# Patient Record
Sex: Female | Born: 1980 | Race: White | Hispanic: No | Smoking: Never smoker
Health system: Southern US, Community
[De-identification: ages and names within clinical notes are randomized; demographics above are authoritative.]

## PROBLEM LIST (undated history)

## (undated) ENCOUNTER — Inpatient Hospital Stay (HOSPITAL_COMMUNITY): Payer: Self-pay

## (undated) DIAGNOSIS — F419 Anxiety disorder, unspecified: Secondary | ICD-10-CM

## (undated) DIAGNOSIS — E559 Vitamin D deficiency, unspecified: Secondary | ICD-10-CM

## (undated) DIAGNOSIS — E611 Iron deficiency: Secondary | ICD-10-CM

## (undated) DIAGNOSIS — E039 Hypothyroidism, unspecified: Secondary | ICD-10-CM

## (undated) DIAGNOSIS — Z87442 Personal history of urinary calculi: Secondary | ICD-10-CM

## (undated) DIAGNOSIS — Z8632 Personal history of gestational diabetes: Secondary | ICD-10-CM

## (undated) DIAGNOSIS — D649 Anemia, unspecified: Secondary | ICD-10-CM

## (undated) HISTORY — DX: Vitamin D deficiency, unspecified: E55.9

## (undated) HISTORY — DX: Anxiety disorder, unspecified: F41.9

## (undated) HISTORY — DX: Personal history of gestational diabetes: Z86.32

## (undated) HISTORY — DX: Hypothyroidism, unspecified: E03.9

## (undated) HISTORY — DX: Personal history of urinary calculi: Z87.442

## (undated) HISTORY — PX: NO PAST SURGERIES: SHX2092

## (undated) HISTORY — DX: Anemia, unspecified: D64.9

## (undated) HISTORY — DX: Iron deficiency: E61.1

---
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
## 2001-08-14 ENCOUNTER — Other Ambulatory Visit: Admission: RE | Admit: 2001-08-14 | Discharge: 2001-08-14 | Payer: Self-pay | Admitting: Gynecology

## 2002-08-23 ENCOUNTER — Other Ambulatory Visit: Admission: RE | Admit: 2002-08-23 | Discharge: 2002-08-23 | Payer: Self-pay | Admitting: Gynecology

## 2003-09-18 ENCOUNTER — Other Ambulatory Visit: Admission: RE | Admit: 2003-09-18 | Discharge: 2003-09-18 | Payer: Self-pay | Admitting: Gynecology

## 2004-11-27 ENCOUNTER — Other Ambulatory Visit: Admission: RE | Admit: 2004-11-27 | Discharge: 2004-11-27 | Payer: Self-pay | Admitting: Gynecology

## 2005-12-02 ENCOUNTER — Other Ambulatory Visit: Admission: RE | Admit: 2005-12-02 | Discharge: 2005-12-02 | Payer: Self-pay | Admitting: Gynecology

## 2006-12-13 ENCOUNTER — Other Ambulatory Visit: Admission: RE | Admit: 2006-12-13 | Discharge: 2006-12-13 | Payer: Self-pay | Admitting: Gynecology

## 2008-02-14 ENCOUNTER — Other Ambulatory Visit: Admission: RE | Admit: 2008-02-14 | Discharge: 2008-02-14 | Payer: Self-pay | Admitting: Gynecology

## 2009-02-13 ENCOUNTER — Ambulatory Visit: Payer: Self-pay | Admitting: Women's Health

## 2009-02-26 ENCOUNTER — Ambulatory Visit: Payer: Self-pay | Admitting: Women's Health

## 2009-08-12 ENCOUNTER — Encounter: Admission: RE | Admit: 2009-08-12 | Discharge: 2009-08-12 | Payer: Self-pay | Admitting: Obstetrics and Gynecology

## 2009-10-16 ENCOUNTER — Inpatient Hospital Stay (HOSPITAL_COMMUNITY): Admission: AD | Admit: 2009-10-16 | Discharge: 2009-10-20 | Payer: Self-pay | Admitting: Obstetrics and Gynecology

## 2009-10-18 ENCOUNTER — Encounter (INDEPENDENT_AMBULATORY_CARE_PROVIDER_SITE_OTHER): Payer: Self-pay | Admitting: Obstetrics and Gynecology

## 2011-02-16 LAB — CBC
HCT: 39.2 % (ref 36.0–46.0)
Hemoglobin: 10.5 g/dL — ABNORMAL LOW (ref 12.0–15.0)
Hemoglobin: 12.9 g/dL (ref 12.0–15.0)
MCV: 90.9 fL (ref 78.0–100.0)
Platelets: 110 10*3/uL — ABNORMAL LOW (ref 150–400)
Platelets: 146 10*3/uL — ABNORMAL LOW (ref 150–400)
RDW: 14 % (ref 11.5–15.5)
WBC: 9.9 10*3/uL (ref 4.0–10.5)

## 2011-02-16 LAB — GLUCOSE, CAPILLARY
Glucose-Capillary: 113 mg/dL — ABNORMAL HIGH (ref 70–99)
Glucose-Capillary: 78 mg/dL (ref 70–99)
Glucose-Capillary: 87 mg/dL (ref 70–99)
Glucose-Capillary: 93 mg/dL (ref 70–99)

## 2011-02-16 LAB — URINE CULTURE: Colony Count: NO GROWTH

## 2011-02-16 LAB — RH IMMUNE GLOB WKUP(>/=20WKS)(NOT WOMEN'S HOSP): Fetal Screen: NEGATIVE

## 2012-11-15 NOTE — L&D Delivery Note (Signed)
Delivery Note At 1:55 AM a viable and healthy female was delivered via Vaginal, Spontaneous Delivery (Presentation: Right Occiput Anterior).  APGAR: 8, 9; weight .  pending Placenta status: Intact, Spontaneous.not sent  Cord: 3 vessels with the following complications: None.  Cord pH: none  Anesthesia: Epidural  Episiotomy: None Lacerations: 2nd degree;Sulcus;Vaginal;Perineal Suture Repair: 3.0 chromic Est. Blood Loss (mL): 300  Mom to postpartum.  Baby to nursery-stable.  Deborah King A 07/24/2013, 2:28 AM

## 2012-12-19 LAB — OB RESULTS CONSOLE ABO/RH

## 2012-12-19 LAB — OB RESULTS CONSOLE RUBELLA ANTIBODY, IGM: Rubella: IMMUNE

## 2012-12-19 LAB — OB RESULTS CONSOLE HEPATITIS B SURFACE ANTIGEN: Hepatitis B Surface Ag: NEGATIVE

## 2012-12-19 LAB — OB RESULTS CONSOLE HIV ANTIBODY (ROUTINE TESTING): HIV: NONREACTIVE

## 2013-01-02 LAB — OB RESULTS CONSOLE GC/CHLAMYDIA
Chlamydia: NEGATIVE
Gonorrhea: NEGATIVE

## 2013-03-12 ENCOUNTER — Ambulatory Visit (HOSPITAL_COMMUNITY)
Admission: RE | Admit: 2013-03-12 | Discharge: 2013-03-12 | Disposition: A | Discharge status: Home / Self Care | Payer: BC Managed Care – PPO | Source: Ambulatory Visit | Attending: Obstetrics and Gynecology | Admitting: Obstetrics and Gynecology

## 2013-03-12 ENCOUNTER — Other Ambulatory Visit (HOSPITAL_COMMUNITY): Payer: Self-pay | Admitting: Obstetrics and Gynecology

## 2013-03-12 ENCOUNTER — Encounter (HOSPITAL_COMMUNITY): Payer: Self-pay | Admitting: General Practice

## 2013-03-12 ENCOUNTER — Encounter (HOSPITAL_COMMUNITY): Payer: Self-pay

## 2013-03-12 ENCOUNTER — Inpatient Hospital Stay (HOSPITAL_COMMUNITY)
Admission: AD | Admit: 2013-03-12 | Discharge: 2013-03-13 | DRG: 886 | Disposition: A | Discharge status: Home / Self Care | Payer: BC Managed Care – PPO | Source: Ambulatory Visit | Attending: Obstetrics and Gynecology | Admitting: Obstetrics and Gynecology

## 2013-03-12 DIAGNOSIS — R319 Hematuria, unspecified: Secondary | ICD-10-CM

## 2013-03-12 DIAGNOSIS — R1031 Right lower quadrant pain: Secondary | ICD-10-CM | POA: Insufficient documentation

## 2013-03-12 DIAGNOSIS — O210 Mild hyperemesis gravidarum: Secondary | ICD-10-CM | POA: Insufficient documentation

## 2013-03-12 DIAGNOSIS — N200 Calculus of kidney: Secondary | ICD-10-CM | POA: Diagnosis present

## 2013-03-12 DIAGNOSIS — O26839 Pregnancy related renal disease, unspecified trimester: Principal | ICD-10-CM | POA: Diagnosis present

## 2013-03-12 DIAGNOSIS — N1330 Unspecified hydronephrosis: Secondary | ICD-10-CM | POA: Diagnosis present

## 2013-03-12 DIAGNOSIS — N201 Calculus of ureter: Secondary | ICD-10-CM | POA: Insufficient documentation

## 2013-03-12 DIAGNOSIS — O99891 Other specified diseases and conditions complicating pregnancy: Secondary | ICD-10-CM | POA: Insufficient documentation

## 2013-03-12 MED ORDER — LACTATED RINGERS IV SOLN
INTRAVENOUS | Status: DC
  Administered 2013-03-12 – 2013-03-13 (×4): via INTRAVENOUS

## 2013-03-12 MED ORDER — ACETAMINOPHEN 325 MG PO TABS: 650.0000 mg | ORAL_TABLET | ORAL | Status: DC | PRN

## 2013-03-12 MED ORDER — DOCUSATE SODIUM 100 MG PO CAPS
100.0000 mg | ORAL_CAPSULE | Freq: Every day | ORAL | Status: DC
  Administered 2013-03-13: 100 mg via ORAL
  Filled 2013-03-12: qty 1

## 2013-03-12 MED ORDER — CALCIUM CARBONATE ANTACID 500 MG PO CHEW: 2.0000 | CHEWABLE_TABLET | ORAL | Status: DC | PRN

## 2013-03-12 MED ORDER — PRENATAL MULTIVITAMIN CH
1.0000 | ORAL_TABLET | Freq: Every day | ORAL | Status: DC
Start: 2013-03-13 — End: 2013-03-13
  Administered 2013-03-13: 1 via ORAL
  Filled 2013-03-12: qty 1

## 2013-03-12 MED ORDER — ZOLPIDEM TARTRATE 5 MG PO TABS: 5.0000 mg | ORAL_TABLET | Freq: Every evening | ORAL | Status: DC | PRN

## 2013-03-12 MED ORDER — HYDROCODONE-ACETAMINOPHEN 5-325 MG PO TABS
1.0000 | ORAL_TABLET | Freq: Four times a day (QID) | ORAL | Status: DC | PRN
Start: 2013-03-12 — End: 2013-03-13

## 2013-03-12 NOTE — H&P (Signed)
Deborah King is a 32 y.o. female  G34P1001 MWF @ [redacted] weeks gestation presenting for admission after diagnosis of right kidney stone with associated right hydronephrosis. Pt was seen emergently for evaluation of  acute right lower quadrant pain @ 6:15 am. Pt noted pain radiates to right back. (+) nausea and one episode of vomiting. She denies any urinary symptoms. BM regular. (+) FM.Marland Kitchen Evaluation showed moderate microscopic hematuria. Urine culture sent. Pt was sent for Ct scan which showed 4 mm right stone w/ hydronephrosis.  History OB History   Grav Para Term Preterm Abortions TAB SAB Ect Mult Living   2 1 1       1      Past Medical History  Diagnosis Date  . Medical history non-contributory    Past Surgical History  Procedure Laterality Date  . No past surgeries     Family History: family history is not on file. Social History:  reports that she has never smoked. She does not have any smokeless tobacco history on file. She reports that she does not drink alcohol or use illicit drugs.   Prenatal Transfer Tool  Maternal Diabetes: No Genetic Screening: Declined Maternal Ultrasounds/Referrals: Normal Fetal Ultrasounds or other Referrals:  None Maternal Substance Abuse:  No Significant Maternal Medications:  None Significant Maternal Lab Results:  None Other Comments:  Rh neg. early 1hr GCT @ 16 weeks nl. repeat 1hr GCt @ 28 weeks planned  ROS neg except HPI   Blood pressure 121/75, pulse 91, temperature 98.3 F (36.8 C), temperature source Oral, resp. rate 20, height 5\' 6"  (1.676 m), weight 74.39 kg (164 lb), last menstrual period 10/23/2012. Maternal Exam:  Introitus: Normal vulva. Normal vagina.  Cervix: Cervix evaluated by digital exam.     Fetal Exam Fetal Monitor Review: Mode: hand-held doppler probe.       Physical Exam  Vitals reviewed. Constitutional: She is oriented to person, place, and time. She appears well-developed and well-nourished. She appears distressed.   Neck: Neck supple.  Cardiovascular: Normal rate and regular rhythm.   Respiratory: Breath sounds normal.  GI: Soft.  Genitourinary: Vagina normal.  Musculoskeletal: She exhibits no edema.  Neurological: She is alert and oriented to person, place, and time.  Skin: Skin is warm and dry.  Psychiatric: She has a normal mood and affect.    Prenatal labs: ABO, Rh:  O neg Antibody:  neg Rubella:  Immune RPR:   NR HBsAg:   neg HIV:   neg GBS:   not done as yet  Assessment/Plan: Right kidney stone IUP @ 20 weeks Right hydronephrosis P) admit. Aggressive IV fluid. FHR q shift. Lemonade.strain urine for stone. If no further pain and no evidence of stone, sono to assess hydronephrosis  Tomorrow. ucx sent from office  Bambi Fehnel A 03/12/2013, 6:14 PM

## 2013-03-13 ENCOUNTER — Encounter (HOSPITAL_COMMUNITY): Payer: Self-pay | Admitting: Obstetrics and Gynecology

## 2013-03-13 ENCOUNTER — Inpatient Hospital Stay (HOSPITAL_COMMUNITY): Payer: BC Managed Care – PPO

## 2013-03-13 NOTE — Progress Notes (Signed)
HD #2 S: denies any back or abdominal pain (+) FM O: VSS Afebrile Lungs clear to A Cor: RRR Abdomen; gravid nontender Back(-)CVAT  Renal sono results reviewed w/ pt: right hydronephrosis, bilateral jets  ImP: RLQ pain resolved Right kidney stone with right hydronephrosis IUP @ 20 1/7 weeks P) d/c home. Cont increase fluid particularly lemonade, strain urine. Call if back pain recurs. Keep sched appt 5/9

## 2013-03-13 NOTE — Progress Notes (Signed)
Discharge teaching complete. Pt verbalizes understanding of discharge information.  Pt stable and denies pain or discomfort.  Diet for patients with kidney stones handout given to pt.  Ambulated to car.  Husband driving pt home.

## 2013-03-13 NOTE — Discharge Summary (Signed)
Obstetric Discharge Summary Reason for Admission: right kidney stone, RLQ pain, IUP @ 20 weeks, right hydronephrosis Prenatal Procedures: ultrasound Intrapartum Procedures: IVF Postpartum Procedures: n/King Complications-Operative and Postpartum: n/King Hemoglobin  Date Value Range Status  10/19/2009 10.5* 12.0 - 15.0 g/dL Final     HCT  Date Value Range Status  10/19/2009 31.1* 36.0 - 46.0 % Final    Physical Exam:  General:WDWN inNAD Skin (-) lesion Lungs clear to King Cor RRR Abd gravid@ u, nontender Pelvic deferred Back (-) CVAT Ext(-) edema  Hospital course: pt was admitted after w/u for acute RLQ pain revealed right kidney stone w/ right hydronephrosis. Pt was given aggressive IVF. Pain resolved however renal sono showed persistence of right hydronephrosis . Given resolution of pain, pt was deemed stable for discharge Discharge Diagnoses: right kidney stone, right hydronephrosis, IUP@ 20 1/7 weeks  Discharge Information: Date: 03/13/2013 Activity: unrestricted Diet: routine Medications: PNV Condition: stable Instructions: call if pain recurs Discharge to: home Follow-up Information   Follow up with Deborah Conway A, MD On 03/23/2013.   Contact information:   [address] [place] [address] [phone]       Newborn Data: This patient has no babies on file  Deborah King King 03/13/2013, 12:59 PM

## 2013-05-30 ENCOUNTER — Encounter: Payer: Self-pay | Admitting: *Deleted

## 2013-05-30 ENCOUNTER — Encounter: Payer: BC Managed Care – PPO | Attending: Obstetrics and Gynecology | Admitting: *Deleted

## 2013-05-30 VITALS — Ht 64.0 in | Wt 176.3 lb

## 2013-05-30 DIAGNOSIS — Z8632 Personal history of gestational diabetes: Secondary | ICD-10-CM

## 2013-05-30 DIAGNOSIS — O99810 Abnormal glucose complicating pregnancy: Secondary | ICD-10-CM | POA: Insufficient documentation

## 2013-05-30 DIAGNOSIS — Z713 Dietary counseling and surveillance: Secondary | ICD-10-CM | POA: Insufficient documentation

## 2013-05-30 NOTE — Patient Instructions (Signed)
GOALS:  Check glucose levels per MD as instructed  Follow Gestational Diabetes Diet as instructed  Call for follow-up as needed 

## 2013-05-30 NOTE — Progress Notes (Signed)
Patient was seen on 05-30-13  for Gestational Diabetes self-management class at the [facility]. The following learning objectives were met by the patient during this course:             States the definition of Gestational Diabetes           States why dietary management is important in controlling blood glucose           Describes the effects each nutrient has on blood glucose levels           Demonstrates ability to create a balanced meal plan          Demonstrates carbohydrate counting            States when to check blood glucose levels          Demonstrates proper blood glucose monitoring techniques           States the effect of stress and exercise on blood glucose levels          States the importance of limiting caffeine and abstaining from alcohol and smoking   Blood glucose monitor given: Contour Next EZ Lot # [id number] Exp: 10/2014 Blood glucose reading: 89 mg/dl  Patient instructed to monitor glucose levels: FBS: 60 - <90 1 hour: <140 2 hour: <120   *Patient received handouts:           Nutrition Diabetes and Pregnancy          Carbohydrate Counting List   Patient will be seen for follow-up as needed

## 2013-05-31 ENCOUNTER — Encounter: Payer: Self-pay | Admitting: *Deleted

## 2013-07-18 ENCOUNTER — Telehealth (HOSPITAL_COMMUNITY): Payer: Self-pay | Admitting: *Deleted

## 2013-07-18 ENCOUNTER — Encounter (HOSPITAL_COMMUNITY): Payer: Self-pay | Admitting: *Deleted

## 2013-07-18 NOTE — Telephone Encounter (Signed)
Preadmission screen  

## 2013-07-19 ENCOUNTER — Other Ambulatory Visit: Payer: Self-pay | Admitting: Obstetrics and Gynecology

## 2013-07-23 ENCOUNTER — Inpatient Hospital Stay (HOSPITAL_COMMUNITY): Payer: BC Managed Care – PPO | Admitting: Anesthesiology

## 2013-07-23 ENCOUNTER — Encounter (HOSPITAL_COMMUNITY): Payer: Self-pay

## 2013-07-23 ENCOUNTER — Inpatient Hospital Stay (HOSPITAL_COMMUNITY)
Admission: RE | Admit: 2013-07-23 | Discharge: 2013-07-25 | DRG: 372 | Disposition: A | Discharge status: Home / Self Care | Payer: BC Managed Care – PPO | Source: Ambulatory Visit | Attending: Obstetrics and Gynecology | Admitting: Obstetrics and Gynecology

## 2013-07-23 ENCOUNTER — Encounter (HOSPITAL_COMMUNITY): Payer: Self-pay | Admitting: Anesthesiology

## 2013-07-23 VITALS — BP 106/66 | HR 79 | Temp 97.4°F | Resp 18 | Ht 64.0 in | Wt 190.0 lb

## 2013-07-23 DIAGNOSIS — O9903 Anemia complicating the puerperium: Secondary | ICD-10-CM | POA: Diagnosis not present

## 2013-07-23 DIAGNOSIS — O99814 Abnormal glucose complicating childbirth: Principal | ICD-10-CM | POA: Diagnosis present

## 2013-07-23 DIAGNOSIS — D62 Acute posthemorrhagic anemia: Secondary | ICD-10-CM | POA: Diagnosis not present

## 2013-07-23 DIAGNOSIS — O360990 Maternal care for other rhesus isoimmunization, unspecified trimester, not applicable or unspecified: Secondary | ICD-10-CM | POA: Diagnosis present

## 2013-07-23 DIAGNOSIS — O2690 Pregnancy related conditions, unspecified, unspecified trimester: Secondary | ICD-10-CM

## 2013-07-23 LAB — GLUCOSE, CAPILLARY
Glucose-Capillary: 76 mg/dL (ref 70–99)
Glucose-Capillary: 78 mg/dL (ref 70–99)
Glucose-Capillary: 124 mg/dL — ABNORMAL HIGH (ref 70–99)
Glucose-Capillary: 108 mg/dL — ABNORMAL HIGH (ref 70–99)

## 2013-07-23 LAB — CBC
HCT: 32.4 % — ABNORMAL LOW (ref 36.0–46.0)
Hemoglobin: 10.2 g/dL — ABNORMAL LOW (ref 12.0–15.0)
MCHC: 31.5 g/dL (ref 30.0–36.0)
MCV: 79.4 fL (ref 78.0–100.0)
RDW: 14 % (ref 11.5–15.5)

## 2013-07-23 MED ORDER — ONDANSETRON HCL 4 MG/2ML IJ SOLN
4.0000 mg | Freq: Four times a day (QID) | INTRAMUSCULAR | Status: DC | PRN
  Administered 2013-07-23: 4 mg via INTRAVENOUS
  Filled 2013-07-23: qty 2

## 2013-07-23 MED ORDER — DIPHENHYDRAMINE HCL 50 MG/ML IJ SOLN: 12.5000 mg | INTRAMUSCULAR | Status: DC | PRN

## 2013-07-23 MED ORDER — LACTATED RINGERS IV SOLN
INTRAVENOUS | Status: DC
  Administered 2013-07-23: 23:00:00 via INTRAVENOUS
  Administered 2013-07-23: 1000 mL via INTRAVENOUS

## 2013-07-23 MED ORDER — CITRIC ACID-SODIUM CITRATE 334-500 MG/5ML PO SOLN
30.0000 mL | ORAL | Status: DC | PRN
  Administered 2013-07-23 (×2): 30 mL via ORAL
  Filled 2013-07-23 (×2): qty 15

## 2013-07-23 MED ORDER — NALBUPHINE SYRINGE 5 MG/0.5 ML
10.0000 mg | INJECTION | INTRAMUSCULAR | Status: DC | PRN
  Administered 2013-07-23: 10 mg via INTRAVENOUS
  Filled 2013-07-23 (×2): qty 0.5
  Filled 2013-07-23: qty 1

## 2013-07-23 MED ORDER — OXYTOCIN 40 UNITS IN LACTATED RINGERS INFUSION - SIMPLE MED
1.0000 m[IU]/min | INTRAVENOUS | Status: DC
  Administered 2013-07-23: 14 m[IU]/min via INTRAVENOUS
  Administered 2013-07-23: 2 m[IU]/min via INTRAVENOUS
  Administered 2013-07-23: 12 m[IU]/min via INTRAVENOUS
  Filled 2013-07-23: qty 1000

## 2013-07-23 MED ORDER — SODIUM BICARBONATE 8.4 % IV SOLN
INTRAVENOUS | Status: DC | PRN
  Administered 2013-07-23: 5 mL via EPIDURAL

## 2013-07-23 MED ORDER — OXYTOCIN 10 UNIT/ML IJ SOLN: 10.0000 [IU] | Freq: Once | INTRAMUSCULAR | Status: DC

## 2013-07-23 MED ORDER — IBUPROFEN 600 MG PO TABS
600.0000 mg | ORAL_TABLET | Freq: Four times a day (QID) | ORAL | Status: DC | PRN
  Administered 2013-07-24: 600 mg via ORAL
  Filled 2013-07-23: qty 1

## 2013-07-23 MED ORDER — LACTATED RINGERS IV SOLN
500.0000 mL | INTRAVENOUS | Status: DC | PRN
  Administered 2013-07-23: 300 mL via INTRAVENOUS

## 2013-07-23 MED ORDER — OXYCODONE-ACETAMINOPHEN 5-325 MG PO TABS: 1.0000 | ORAL_TABLET | ORAL | Status: DC | PRN

## 2013-07-23 MED ORDER — PHENYLEPHRINE 40 MCG/ML (10ML) SYRINGE FOR IV PUSH (FOR BLOOD PRESSURE SUPPORT)
80.0000 ug | PREFILLED_SYRINGE | INTRAVENOUS | Status: DC | PRN
  Filled 2013-07-23: qty 2

## 2013-07-23 MED ORDER — EPHEDRINE 5 MG/ML INJ
10.0000 mg | INTRAVENOUS | Status: DC | PRN
  Filled 2013-07-23: qty 2
  Filled 2013-07-23: qty 4

## 2013-07-23 MED ORDER — TERBUTALINE SULFATE 1 MG/ML IJ SOLN: 0.2500 mg | Freq: Once | INTRAMUSCULAR | Status: AC | PRN

## 2013-07-23 MED ORDER — FENTANYL 2.5 MCG/ML BUPIVACAINE 1/10 % EPIDURAL INFUSION (WH - ANES)
14.0000 mL/h | INTRAMUSCULAR | Status: DC | PRN
  Administered 2013-07-23 (×2): 14 mL/h via EPIDURAL
  Filled 2013-07-23 (×2): qty 125

## 2013-07-23 MED ORDER — OXYTOCIN BOLUS FROM INFUSION
500.0000 mL | INTRAVENOUS | Status: DC
  Administered 2013-07-24: 500 mL via INTRAVENOUS

## 2013-07-23 MED ORDER — LACTATED RINGERS IV SOLN: 500.0000 mL | Freq: Once | INTRAVENOUS | Status: DC

## 2013-07-23 MED ORDER — ACETAMINOPHEN 325 MG PO TABS: 650.0000 mg | ORAL_TABLET | ORAL | Status: DC | PRN

## 2013-07-23 MED ORDER — EPHEDRINE 5 MG/ML INJ
10.0000 mg | INTRAVENOUS | Status: DC | PRN
  Filled 2013-07-23: qty 2

## 2013-07-23 MED ORDER — PHENYLEPHRINE 40 MCG/ML (10ML) SYRINGE FOR IV PUSH (FOR BLOOD PRESSURE SUPPORT)
80.0000 ug | PREFILLED_SYRINGE | INTRAVENOUS | Status: DC | PRN
  Filled 2013-07-23: qty 5
  Filled 2013-07-23: qty 2

## 2013-07-23 MED ORDER — OXYTOCIN 40 UNITS IN LACTATED RINGERS INFUSION - SIMPLE MED: 62.5000 mL/h | INTRAVENOUS | Status: DC

## 2013-07-23 MED ORDER — LIDOCAINE HCL (PF) 1 % IJ SOLN
30.0000 mL | INTRAMUSCULAR | Status: DC | PRN
  Administered 2013-07-24: 30 mL via SUBCUTANEOUS
  Filled 2013-07-23 (×2): qty 30

## 2013-07-23 NOTE — Progress Notes (Signed)
S: comfortable  O: SROM clear fluid @ 5 p Foley balloon out Epidural Pitocin 10 MIU BS 71 VE: 6/80(patulous)/-3/-2 asynclytic IUPC placed  Tracing; baseline 130 reactive ctx q2-3  IMP: Active phase w/ asynclytic position Class A2 GDM  P) cont pitocin. Exaggerated left sims

## 2013-07-23 NOTE — Anesthesia Procedure Notes (Signed)

## 2013-07-23 NOTE — Progress Notes (Signed)
Deborah King is a 32 y.o. G2P1001 at [redacted]w[redacted]d by LMP admitted for induction of labor due to Gestational diabetes.  Subjective: No chief complaint on file. no complaint Pitocin 6 MIU Heartburn resolved  Objective: BP 123/85  Pulse 87  Temp(Src) 98.5 F (36.9 C) (Oral)  Resp 18  Ht 5\' 4"  (1.626 m)  Wt 86.183 kg (190 lb)  BMI 32.6 kg/m2  LMP 10/23/2012      FHT:  FHR: 130 bpm, variability: moderate,  accelerations:  Present,  decelerations:  Absent UC:   irregular, every 2-4 minutes SVE:   3 cm dilated, 90 effaced, -3 station intracervical balloon in place Tracing: cat 1  Labs: Lab Results  Component Value Date   WBC 7.8 07/23/2013   HGB 10.2* 07/23/2013   HCT 32.4* 07/23/2013   MCV 79.4 07/23/2013   PLT 166 07/23/2013  BS 71  Assessment / Plan: Induction of labor due to gestational diabetes,  progressing well on pitocin  P) cont with pitocin. Await balloon expulsion. amniotomy prn Analgesic. Cont BS check q 2hrs  Anticipated MOD:  NSVD  Nash Bolls A 07/23/2013, 1:44 PM

## 2013-07-23 NOTE — Progress Notes (Signed)
S:denies any vaginal/rectal pressure  O: Pitocin Epidural  VE per RN : 6 cm/90/-2  Tracing: baseline 135-140 (+) early decel ctx q 2-4 mins ( 215-280 MV units)  IMP: arrest of dilation w/   Reassuring tracing Class A2 GDM P) may increase pitocin x 1 for ctx q . Cont exaggerated sims position( suspect asynclytism for arrest )

## 2013-07-23 NOTE — H&P (Signed)
Deborah King is a 32 y.o. female presenting for induction @ 39 weeks 2nd to Class A2GDM Maternal Medical History:  Fetal activity: Perceived fetal activity is normal.    Prenatal complications: no prenatal complications Prenatal Complications - Diabetes: gestational. Diabetes is managed by oral agent (monotherapy).      OB History   Grav Para Term Preterm Abortions TAB SAB Ect Mult Living   2 1 1       1      Past Medical History  Diagnosis Date  . Medical history non-contributory   . Pregnancy complication (20wks) 03/13/2013  .  Kidney stone complicating pregnancy (20wks) 03/13/2013  . Diabetes mellitus without complication   . Gestational diabetes     glyburide   Past Surgical History  Procedure Laterality Date  . No past surgeries     Family History: family history includes Arthritis in her paternal grandmother; Cancer in her maternal grandmother and paternal grandmother; Heart disease in her maternal grandfather; Hypertension in her father and paternal grandmother; Seizures in her paternal grandfather; Thyroid disease in her mother. Social History:  reports that she has never smoked. She has never used smokeless tobacco. She reports that she does not drink alcohol or use illicit drugs.   Prenatal Transfer Tool  Maternal Diabetes: Yes:  Diabetes Type:  Insulin/Medication controlled Genetic Screening: Declined Maternal Ultrasounds/Referrals: Normal Fetal Ultrasounds or other Referrals:  None Maternal Substance Abuse:  No Significant Maternal Medications:  Meds include: Other: glyburide Significant Maternal Lab Results:  Lab values include: Group B Strep negative, Rh negative  Other Comments:  None  Review of Systems  Gastrointestinal: Positive for heartburn.  All other systems reviewed and are negative.   neg  Dilation: Fingertip Effacement (%): 50 Station: -3 Exam by:: Dr Cherly Hensen Blood pressure 130/83, pulse 84, resp. rate 20, height 5\' 4"  (1.626 m), weight  86.183 kg (190 lb), last menstrual period 10/23/2012. VE: intracervical balloon placed  Maternal Exam:  Abdomen: Patient reports no abdominal tenderness. Estimated fetal weight is 8 lb.   Fetal presentation: vertex  Introitus: Normal vulva. Pelvis: adequate for delivery.   Cervix: Cervix evaluated by digital exam.     Fetal Exam Fetal Monitor Review: Mode: ultrasound.   Baseline rate: 130.  Variability: moderate (6-25 bpm).   Pattern: accelerations present.    Fetal State Assessment: Category I - tracings are normal.     Physical Exam  Constitutional: She is oriented to person, place, and time. She appears well-developed and well-nourished.  HENT:  Head: Atraumatic.  Neck: Neck supple.  Cardiovascular: Regular rhythm.   Respiratory: Breath sounds normal.  GI: Soft.  Musculoskeletal: Normal range of motion. She exhibits edema (2+).  Neurological: She is alert and oriented to person, place, and time.  Skin: Skin is warm and dry.  Psychiatric: She has a normal mood and affect.    Prenatal labs: ABO, Rh: O/Negative/-- (02/04 0000) Antibody: Negative (02/04 0000) Rubella: Immune (02/04 0000) RPR: Nonreactive (02/04 0000)  HBsAg: Negative (02/04 0000)  HIV: Non-reactive (02/04 0000)  GBS: Negative (08/13 0000)   Assessment/Plan: Class A2 GDM Rh negative Term gestation  P) admit routine labs. Intracervical balloon. Pitocin  BS q 2 hrs Danaria Larsen A 07/23/2013, 9:33 AM

## 2013-07-23 NOTE — Anesthesia Preprocedure Evaluation (Signed)
Anesthesia Evaluation  Patient identified by MRN, date of birth, ID band Patient awake    Reviewed: Allergy & Precautions, H&P , Patient's Chart, lab work & pertinent test results  Airway Mallampati: II TM Distance: >3 FB Neck ROM: full    Dental  (+) Teeth Intact   Pulmonary  breath sounds clear to auscultation        Cardiovascular Rhythm:regular Rate:Normal     Neuro/Psych    GI/Hepatic   Endo/Other  diabetes  Renal/GU      Musculoskeletal   Abdominal   Peds  Hematology   Anesthesia Other Findings       Reproductive/Obstetrics (+) Pregnancy                           Anesthesia Physical Anesthesia Plan  ASA: II  Anesthesia Plan: Epidural   Post-op Pain Management:    Induction:   Airway Management Planned:   Additional Equipment:   Intra-op Plan:   Post-operative Plan:   Informed Consent: I have reviewed the patients History and Physical, chart, labs and discussed the procedure including the risks, benefits and alternatives for the proposed anesthesia with the patient or authorized representative who has indicated his/her understanding and acceptance.   Dental Advisory Given  Plan Discussed with:   Anesthesia Plan Comments: (Labs checked- platelets confirmed with RN in room. Fetal heart tracing, per RN, reported to be stable enough for sitting procedure. Discussed epidural, and patient consents to the procedure:  included risk of possible headache,backache, failed block, allergic reaction, and nerve injury. This patient was asked if she had any questions or concerns before the procedure started.)        Anesthesia Quick Evaluation  

## 2013-07-24 ENCOUNTER — Encounter (HOSPITAL_COMMUNITY): Payer: Self-pay

## 2013-07-24 LAB — ABO/RH: ABO/RH(D): O NEG

## 2013-07-24 LAB — GLUCOSE, CAPILLARY: Glucose-Capillary: 100 mg/dL — ABNORMAL HIGH (ref 70–99)

## 2013-07-24 MED ORDER — FERROUS SULFATE 325 (65 FE) MG PO TABS
325.0000 mg | ORAL_TABLET | Freq: Two times a day (BID) | ORAL | Status: DC
  Administered 2013-07-24 – 2013-07-25 (×3): 325 mg via ORAL
  Filled 2013-07-24 (×3): qty 1

## 2013-07-24 MED ORDER — ZOLPIDEM TARTRATE 5 MG PO TABS: 5.0000 mg | ORAL_TABLET | Freq: Every evening | ORAL | Status: DC | PRN

## 2013-07-24 MED ORDER — SENNOSIDES-DOCUSATE SODIUM 8.6-50 MG PO TABS
2.0000 | ORAL_TABLET | ORAL | Status: DC
  Administered 2013-07-25: 2 via ORAL

## 2013-07-24 MED ORDER — WITCH HAZEL-GLYCERIN EX PADS
1.0000 "application " | MEDICATED_PAD | CUTANEOUS | Status: DC | PRN
  Administered 2013-07-24: 1 via TOPICAL

## 2013-07-24 MED ORDER — OXYCODONE-ACETAMINOPHEN 5-325 MG PO TABS
1.0000 | ORAL_TABLET | ORAL | Status: DC | PRN
  Administered 2013-07-24 (×4): 1 via ORAL
  Filled 2013-07-24 (×4): qty 1

## 2013-07-24 MED ORDER — DIBUCAINE 1 % RE OINT
1.0000 "application " | TOPICAL_OINTMENT | RECTAL | Status: DC | PRN
  Administered 2013-07-24: 1 via RECTAL
  Filled 2013-07-24: qty 28

## 2013-07-24 MED ORDER — LANOLIN HYDROUS EX OINT: TOPICAL_OINTMENT | CUTANEOUS | Status: DC | PRN

## 2013-07-24 MED ORDER — DIPHENHYDRAMINE HCL 25 MG PO CAPS: 25.0000 mg | ORAL_CAPSULE | Freq: Four times a day (QID) | ORAL | Status: DC | PRN

## 2013-07-24 MED ORDER — METHYLERGONOVINE MALEATE 0.2 MG/ML IJ SOLN: 0.2000 mg | INTRAMUSCULAR | Status: DC | PRN

## 2013-07-24 MED ORDER — IBUPROFEN 600 MG PO TABS
600.0000 mg | ORAL_TABLET | Freq: Four times a day (QID) | ORAL | Status: DC
  Administered 2013-07-24 – 2013-07-25 (×5): 600 mg via ORAL
  Filled 2013-07-24 (×5): qty 1

## 2013-07-24 MED ORDER — PRENATAL MULTIVITAMIN CH
1.0000 | ORAL_TABLET | Freq: Every day | ORAL | Status: DC
  Administered 2013-07-24 – 2013-07-25 (×2): 1 via ORAL
  Filled 2013-07-24 (×2): qty 1

## 2013-07-24 MED ORDER — SIMETHICONE 80 MG PO CHEW: 80.0000 mg | CHEWABLE_TABLET | ORAL | Status: DC | PRN

## 2013-07-24 MED ORDER — METHYLERGONOVINE MALEATE 0.2 MG PO TABS: 0.2000 mg | ORAL_TABLET | ORAL | Status: DC | PRN

## 2013-07-24 MED ORDER — BENZOCAINE-MENTHOL 20-0.5 % EX AERO: 1.0000 "application " | INHALATION_SPRAY | CUTANEOUS | Status: DC | PRN

## 2013-07-24 MED ORDER — ONDANSETRON HCL 4 MG/2ML IJ SOLN: 4.0000 mg | INTRAMUSCULAR | Status: DC | PRN

## 2013-07-24 MED ORDER — ONDANSETRON HCL 4 MG PO TABS: 4.0000 mg | ORAL_TABLET | ORAL | Status: DC | PRN

## 2013-07-24 NOTE — Progress Notes (Signed)
S: c/o rectal pressure Nausea  O: VE fully/0 station Tracing: baseline 145-150 (+) early/variable decel with ctx Ctx q 2  mins  IMP: complete Class A2 GDM P) start pushing due to strong urge

## 2013-07-24 NOTE — Anesthesia Postprocedure Evaluation (Signed)
  Anesthesia Post-op Note  Patient: Deborah King  Procedure(s) Performed: * No procedures listed *  Patient Location: PACU and Mother/Baby  Anesthesia Type:Epidural  Level of Consciousness: awake, alert  and oriented  Airway and Oxygen Therapy: Patient Spontanous Breathing  Post-op Pain: none  Post-op Assessment: Post-op Vital signs reviewed, Patient's Cardiovascular Status Stable, No headache, No backache, No residual numbness and No residual motor weakness  Post-op Vital Signs: Reviewed and stable  Complications: No apparent anesthesia complications

## 2013-07-25 LAB — CBC
Hemoglobin: 7.7 g/dL — ABNORMAL LOW (ref 12.0–15.0)
MCH: 25 pg — ABNORMAL LOW (ref 26.0–34.0)
Platelets: 145 10*3/uL — ABNORMAL LOW (ref 150–400)
RBC: 3.08 MIL/uL — ABNORMAL LOW (ref 3.87–5.11)
WBC: 11.4 10*3/uL — ABNORMAL HIGH (ref 4.0–10.5)

## 2013-07-25 LAB — GLUCOSE, CAPILLARY: Glucose-Capillary: 71 mg/dL (ref 70–99)

## 2013-07-25 MED ORDER — OXYCODONE-ACETAMINOPHEN 5-325 MG PO TABS: 1.0000 | ORAL_TABLET | Freq: Four times a day (QID) | ORAL | Status: DC | PRN

## 2013-07-25 MED ORDER — RHO D IMMUNE GLOBULIN 1500 UNIT/2ML IJ SOLN
300.0000 ug | Freq: Once | INTRAMUSCULAR | Status: AC
  Administered 2013-07-25: 300 ug via INTRAMUSCULAR
  Filled 2013-07-25: qty 2

## 2013-07-25 MED ORDER — IBUPROFEN 600 MG PO TABS: 600.0000 mg | ORAL_TABLET | Freq: Four times a day (QID) | ORAL | Status: DC

## 2013-07-25 MED ORDER — POLYSACCHARIDE IRON COMPLEX 150 MG PO CAPS: 150.0000 mg | ORAL_CAPSULE | Freq: Two times a day (BID) | ORAL | Status: DC

## 2013-07-25 NOTE — Discharge Summary (Signed)
Obstetric Discharge Summary Reason for Admission: induction of labor and A2GDM Prenatal Procedures: ultrasound Intrapartum Procedures: spontaneous vaginal delivery Postpartum Procedures: Rho(D) Ig Complications-Operative and Postpartum: 2nd degree perineal laceration and ABL anemia Hemoglobin  Date Value Range Status  07/25/2013 7.7* 12.0 - 15.0 g/dL Final     DELTA CHECK NOTED     REPEATED TO VERIFY     HCT  Date Value Range Status  07/25/2013 25.0* 36.0 - 46.0 % Final    Physical Exam:  General: alert and cooperative Lochia: appropriate Uterine Fundus: firm Incision: healing well, no significant drainage, no dehiscence, no significant erythema DVT Evaluation: No evidence of DVT seen on physical exam. Negative Homan's sign. Calf/Ankle edema is present.  Discharge Diagnoses: Term Pregnancy-delivered, IDA with compounding ABL anemia, A2GDM, delivered  Discharge Information: Date: 07/25/2013 Activity: pelvic rest Diet: routine Medications: PNV, Ibuprofen, Colace, Iron and Percocet Condition: stable Instructions: refer to practice specific booklet Discharge to: home Follow-up Information   Follow up with COUSINS,SHERONETTE A, MD. Schedule an appointment as soon as possible for a visit in 6 weeks. (for postpartum check and glucose screen)    Specialty:  Obstetrics and Gynecology   Contact information:   [address] [place] [address] [phone]       Newborn Data: Live born female on 07/24/13 Birth Weight: 8 lb 9.6 oz (3900 g) APGAR: 8, 9  Home with mother.  Tifini Reeder, N 07/25/2013, 10:26 AM

## 2013-07-25 NOTE — Progress Notes (Addendum)
PPD #1- SVD  Subjective:   Reports feeling good, requesting early discharge Tolerating po/ No nausea or vomiting Bleeding is light No dizziness, SOB, or chest pain Pain controlled with Motrin and Percocet Up ad lib / ambulatory / voiding without problems Newborn: bottlefeeding  / Circumcision: complete   Objective:   VS:  VS:  Filed Vitals:   07/24/13 0543 07/24/13 0950 07/24/13 1737 07/25/13 0539  BP: 108/69 104/70 136/72 106/66  Pulse: 96 79 88 79  Temp: 98.8 F (37.1 C) 98 F (36.7 C) 98.1 F (36.7 C) 97.4 F (36.3 C)  TempSrc: Oral Oral Oral Oral  Resp: 18 20 18 18   Height:      Weight:      SpO2: 97%   99%    LABS:  Recent Labs  07/23/13 0719 07/25/13 0620  WBC 7.8 11.4*  HGB 10.2* 7.7*  PLT 166 145*   Blood type: --/--/O NEG (09/08 0715) Rubella: Immune (02/04 0000)    Physical Exam: Alert and oriented x3 Abdomen: soft, non-tender, non-distended  Fundus: firm, non-tender, U-1 Perineum: Well approximated, no significant erythema, minimal edema, or drainage; healing well. Lochia: small Extremities: 2+ edema BLE, no calf pain or tenderness    Assessment:  PPD # 1G2P2002/ S/P:induced SVD, 2nd degree laceration IDA with compounding ABL anemia, asymptomatic A2GDM, delivered  Rh negative Doing well   Plan: Rhogam prior to discharge Discharge home Niferex 150 mg po bid #60, refills 1 Motrin 600 mg po q 6 hrs prn #30, refills 0 Percocet 5/325 1-2 po q 6 hrs prn severe pain #20, refills 0 Follow-up with [facility] in 6 wks for postpartum and GTT check   Pritesh Sobecki, N MSN, CNM 07/25/2013, 10:17 AM

## 2013-07-26 LAB — RH IG WORKUP (INCLUDES ABO/RH)
ABO/RH(D): O NEG
Antibody Screen: POSITIVE
DAT, IgG: NEGATIVE
Gestational Age(Wks): 39.1

## 2013-08-02 NOTE — Discharge Summary (Signed)
Reviewed and agree with note and plan. V.Haruna Rohlfs, MD  

## 2013-12-26 IMAGING — CT CT ABD-PELV W/O CM
1 of 2 series · 15 of 32 positions shown, 19 images · non-contrast
Comparison: None.

CLINICAL DATA: 20 weeks of pregnancy.  Sudden onset of right lower
quadrant and flank pain, nausea vomiting this morning.  Hematuria.
Evaluate for renal or ureteral stones.

CT ABDOMEN AND PELVIS WITHOUT CONTRAST
TECHNIQUE: Multidetector CT imaging of the abdomen and pelvis was
performed following the standard protocol without intravenous
contrast.

[Series 2: stone · axial · 0.75mm/px · z∈[-430,-45]mm · 15 of 85 slices shown, 19 images]
[im 4/85  soft-tissue]
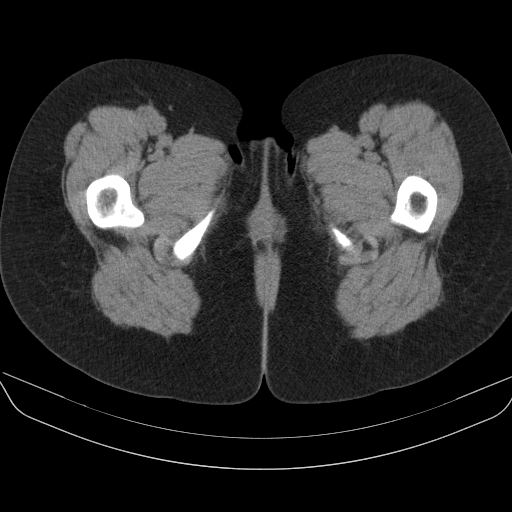
[im 4/85  bone]
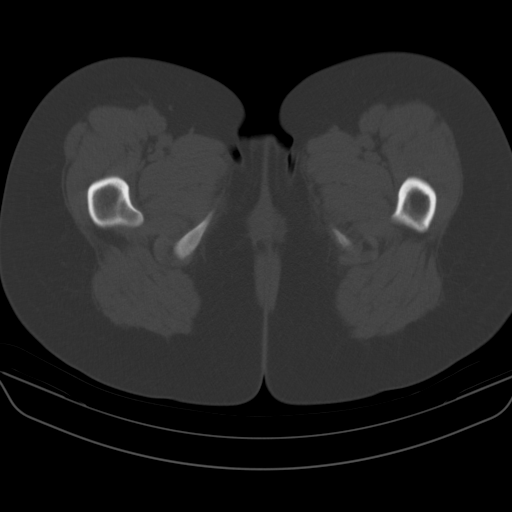
[im 11/85  soft-tissue]
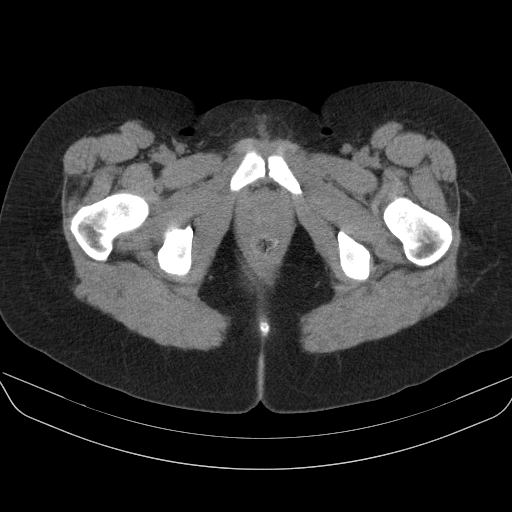
[im 18/85  soft-tissue]
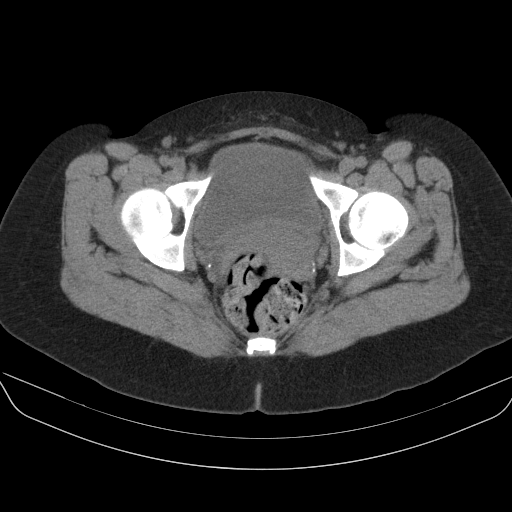
[im 25/85  soft-tissue]
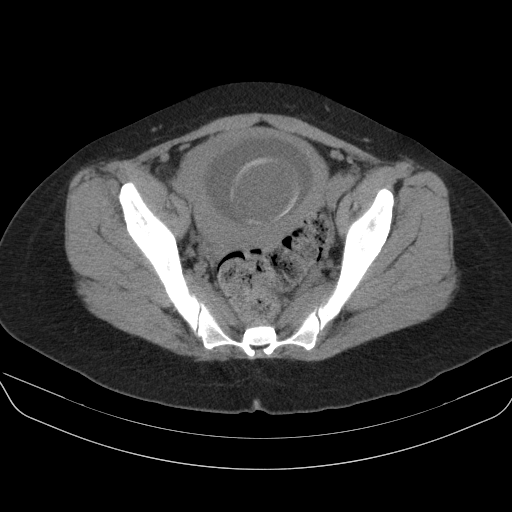
[im 29/85  soft-tissue]
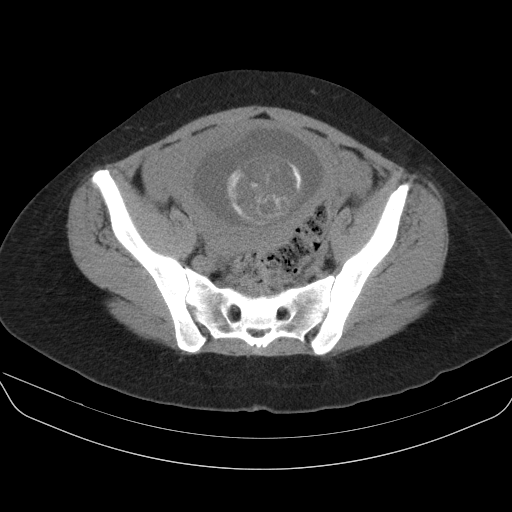
[im 36/85  soft-tissue]
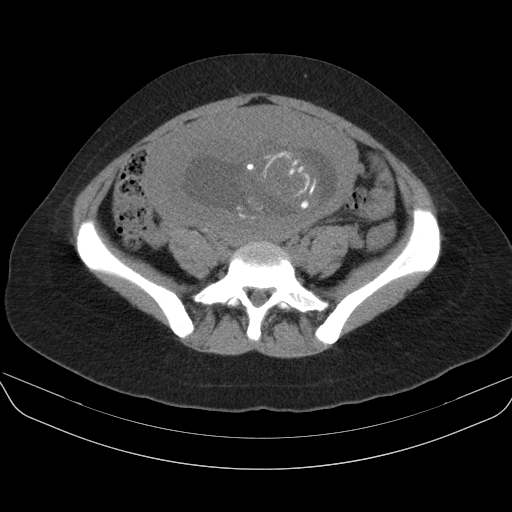
[im 43/85  soft-tissue]
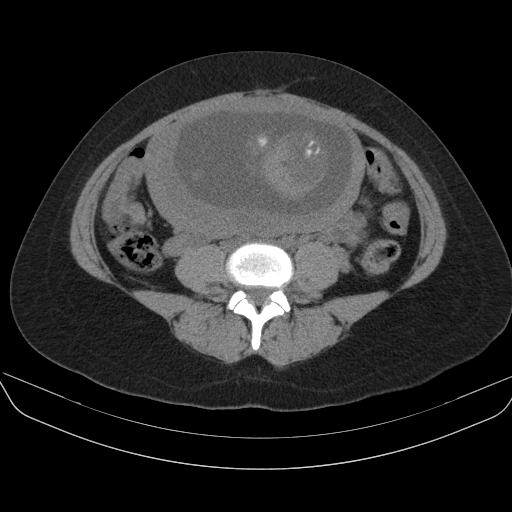
[im 50/85  soft-tissue]
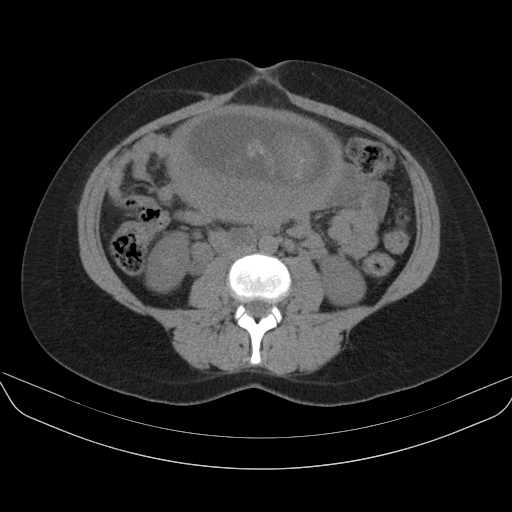
[im 57/85  soft-tissue]
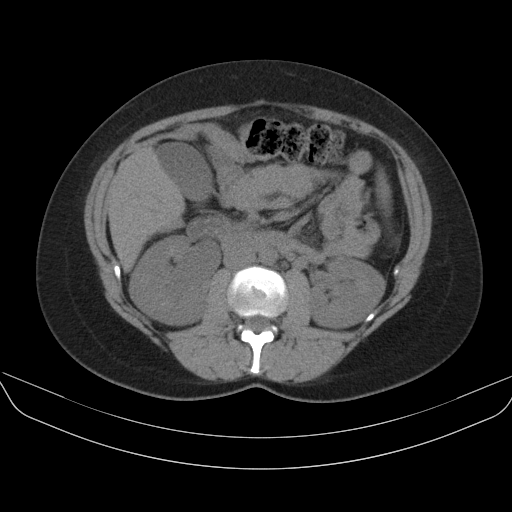
[im 57/85  bone]
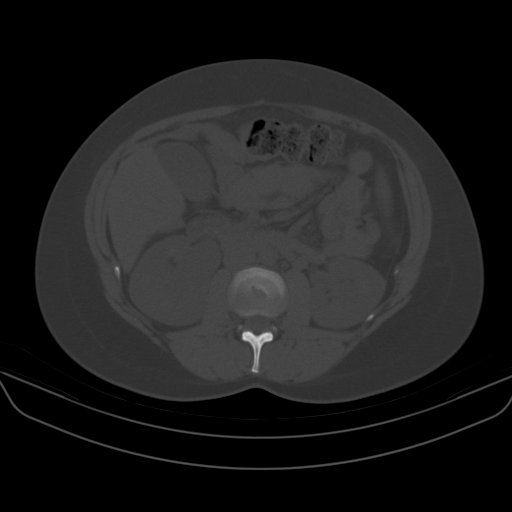
[im 60/85  soft-tissue]
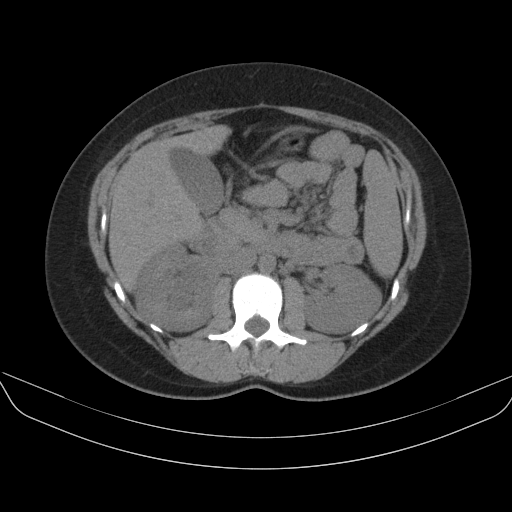
[im 67/85  soft-tissue]
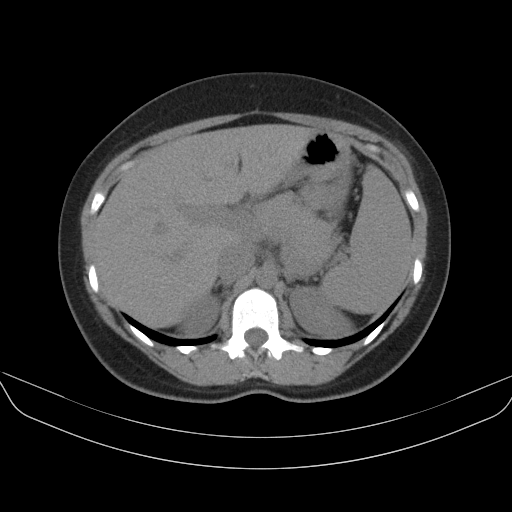
[im 71/85  lung]
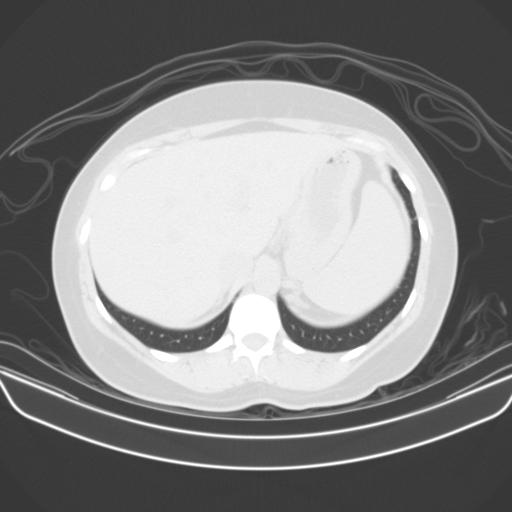
[im 74/85  soft-tissue]
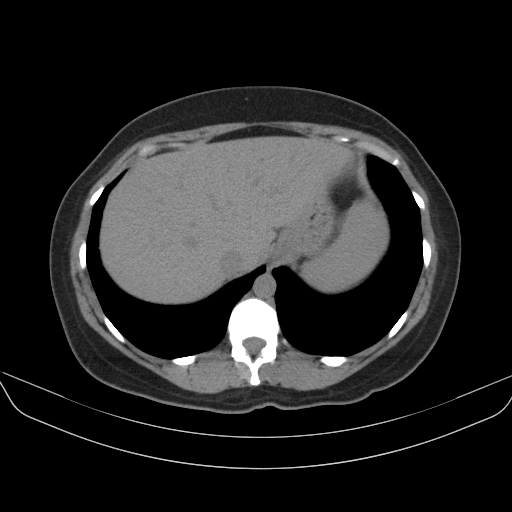
[im 74/85  lung]
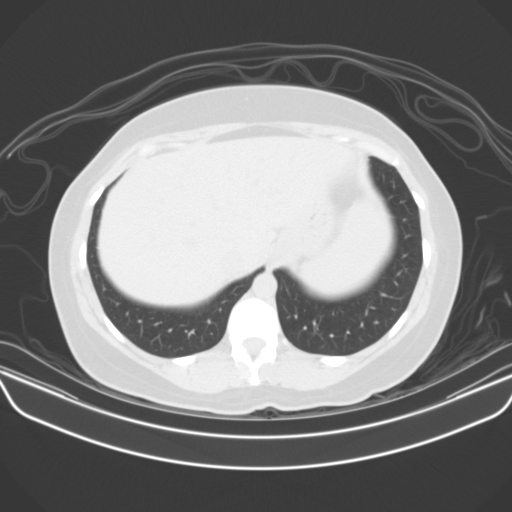
[im 78/85  lung]
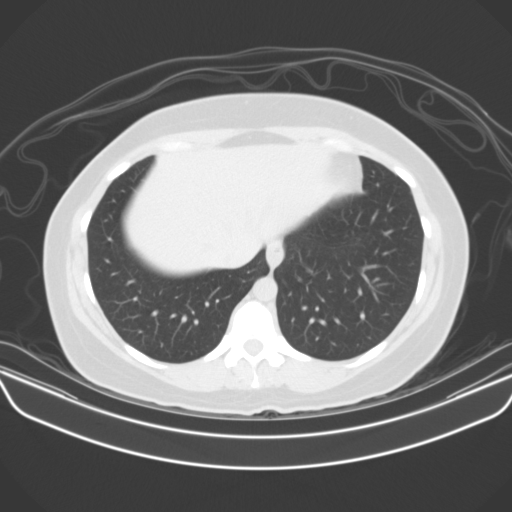
[im 81/85  soft-tissue]
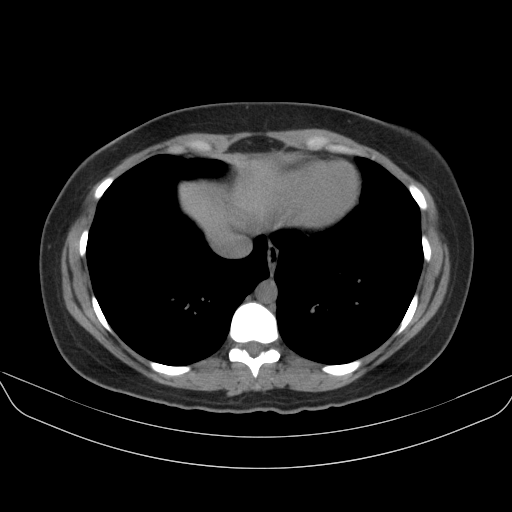
[im 81/85  lung]
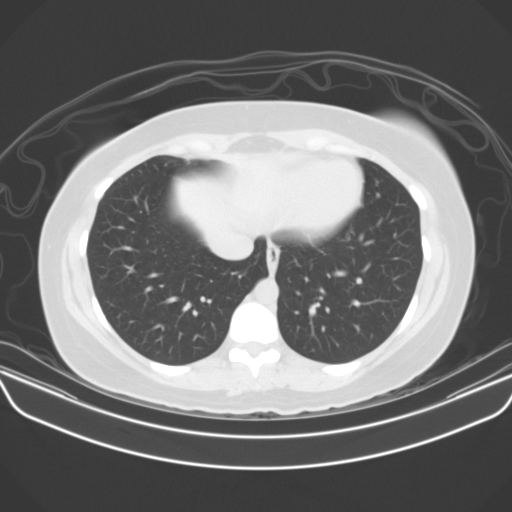

[15 of 32 positions shown; findings below may reference images not displayed]

FINDINGS: The imaged lung bases are clear.  The heart size is
normal.

There is moderate right hydroureteronephrosis. No intrarenal stones
are identified on the right.  Near the level of the pelvic brim, at
L5-S1, is a right ureteral stone measuring 3 x 4 mm in axial
diameter and approximately 6 mm in length. The stone is best seen
on coronal image number 67 and sagittal image number 71.  Distal to
this stone, no ureteral dilatation is appreciated.

The left kidney is normal in size and contour.  There is no
hydronephrosis on the left. No intrarenal stones are identified on
the left.  The left ureter is normal in caliber.  No left ureteral
stone is seen.

Urinary bladder appears within normal limits.

The uterus is gravid. The uterine fundus is approximately 2-3 cm
above the level of the umbilicus.  Fetal anatomic detail is not
evaluated on this noncontrast CT.

No ovarian or adnexal mass is seen. A definite appendix is not
seen.  No pericecal inflammatory changes are identified.  There is
a moderate amount of stool in the colon.  The bowel gas pattern is
nonobstructive.

The noncontrast appearance of the liver, gallbladder, spleen,
adrenal glands and pancreas are within normal limits.

Normal appearance of the bones.
IMPRESSION: 1.   Mid right ureteral stone measures approximately 3 x 4 mm axial
diameter and 6 mm in length and results in moderate proximal right
hydroureteronephrosis. I telephoned this report to [place], Dr.
Reta nurse, at [DATE] on 03/12/2013.
2.  No additional urinary tract calculi are identified.
3.  Gravid uterus.
4.  Moderate stool in the colon.

## 2013-12-27 IMAGING — US US RENAL
1 series · 14 of 25 positions shown · non-contrast
Comparison: CT 03/12/2013

CLINICAL DATA: Follow-up ureteral calculus, 20 weeks pregnant

RENAL/URINARY TRACT ULTRASOUND COMPLETE
TRANSVAGINAL OBSTETRIC ULTRASOUND

[Series 1: us renal · 14 of 41 slices shown]
[im 1/41]
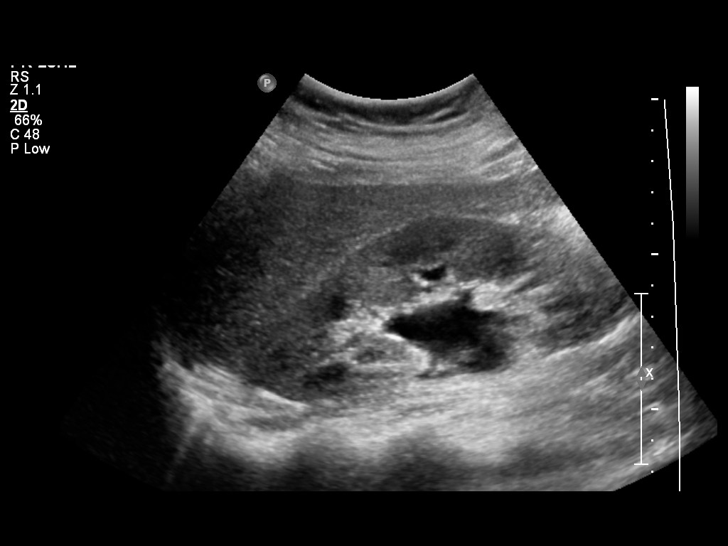
[im 4/41]
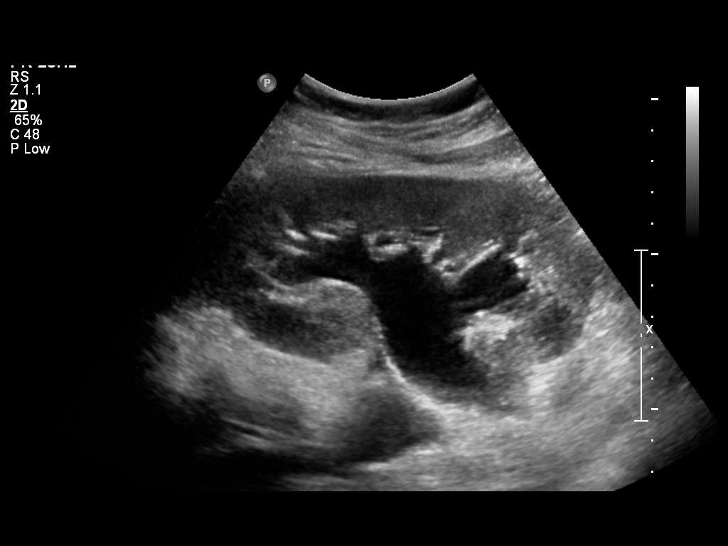
[im 7/41]
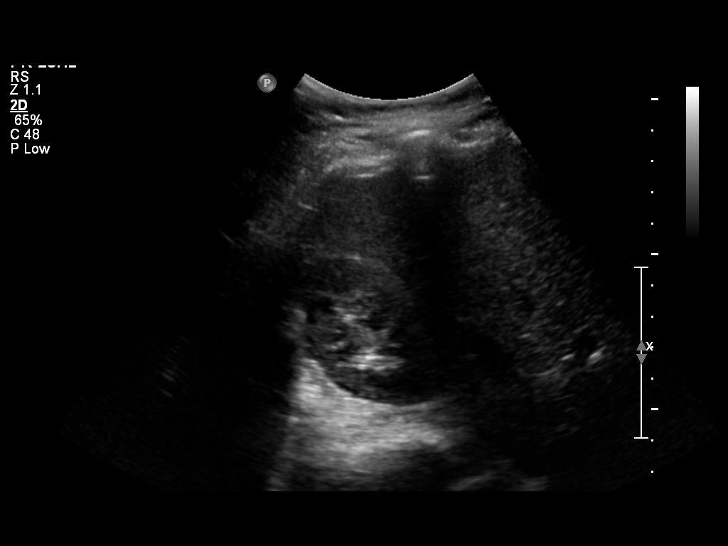
[im 11/41]
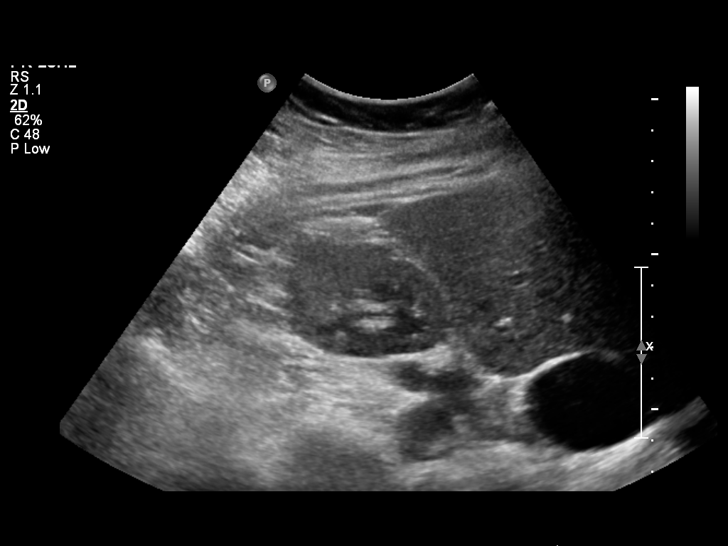
[im 14/41]
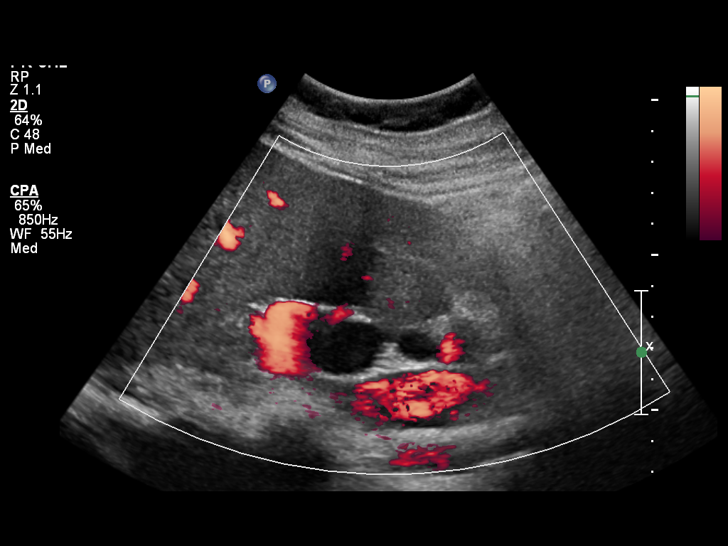
[im 16/41]
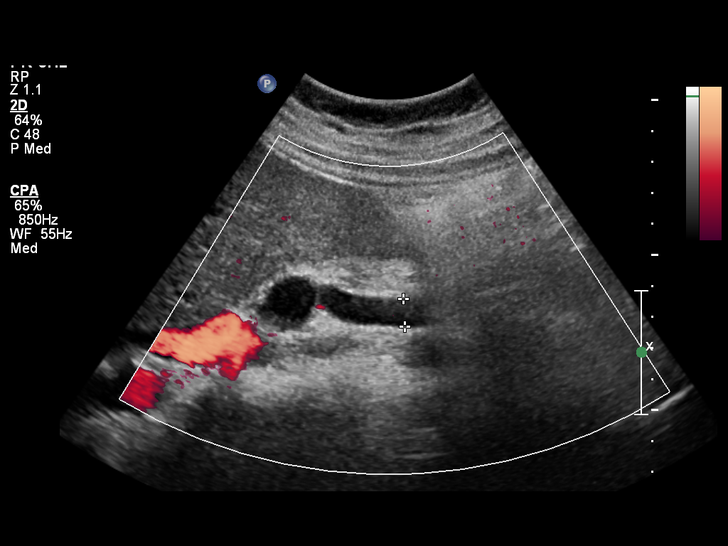
[im 19/41]
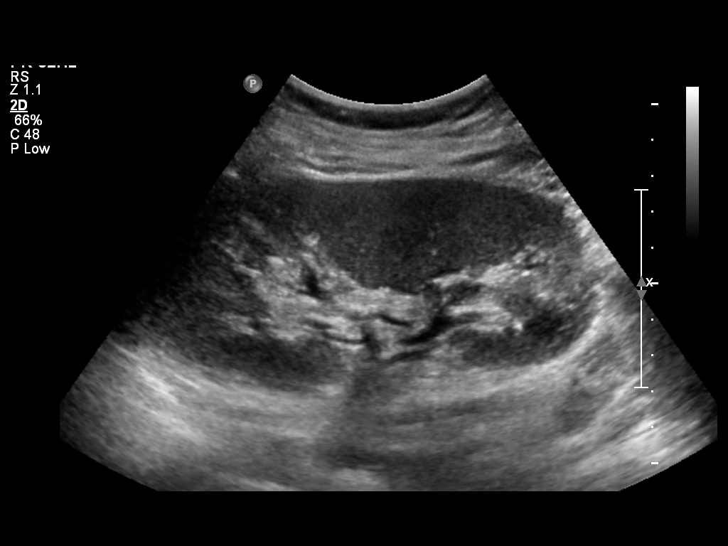
[im 22/41]
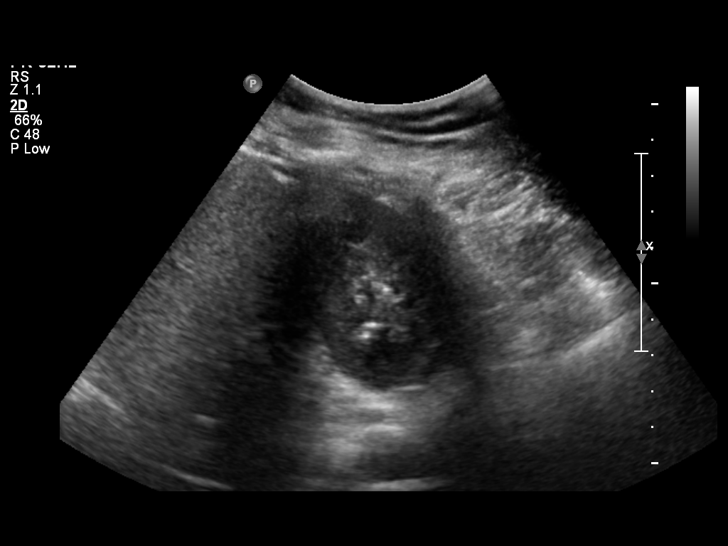
[im 26/41]
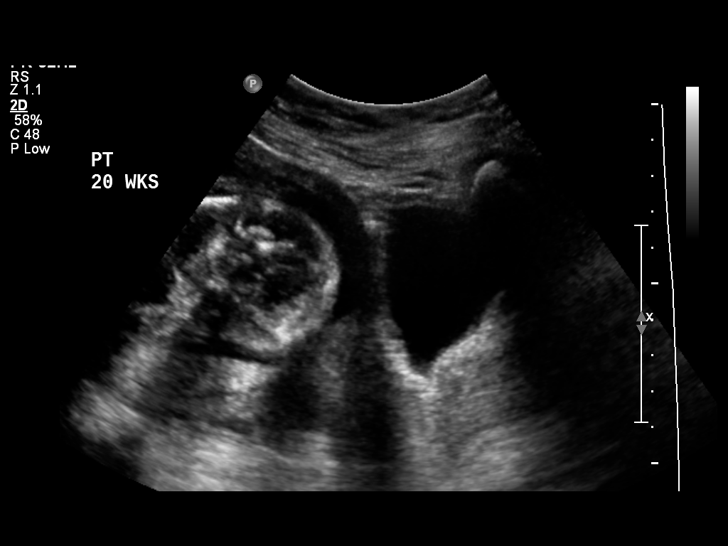
[im 27/41]
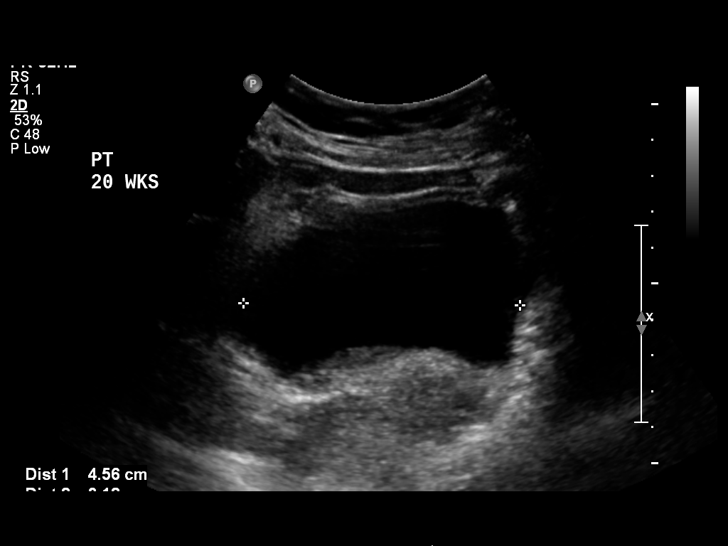
[im 31/41]
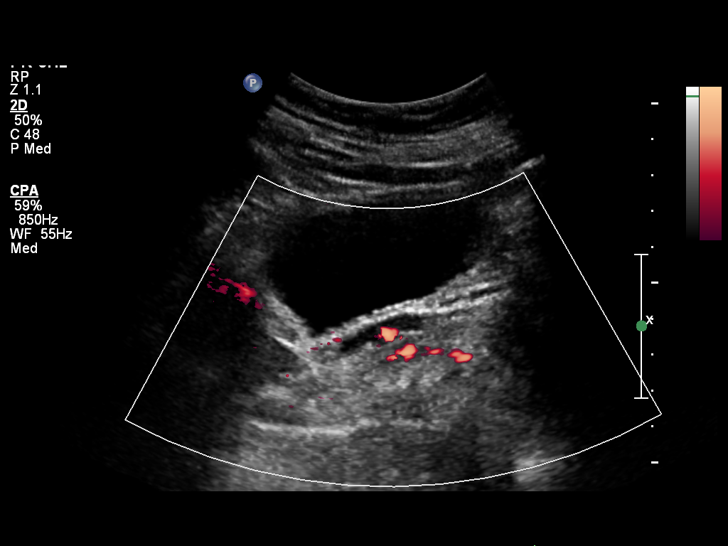
[im 34/41]
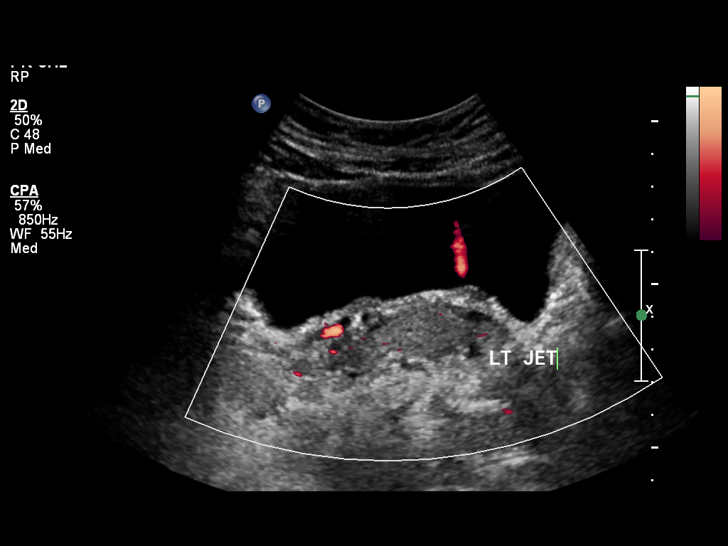
[im 37/41]
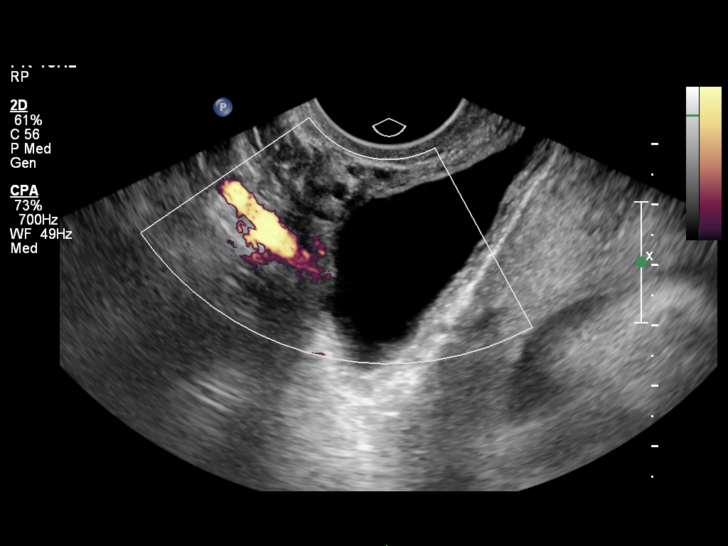
[im 41/41]
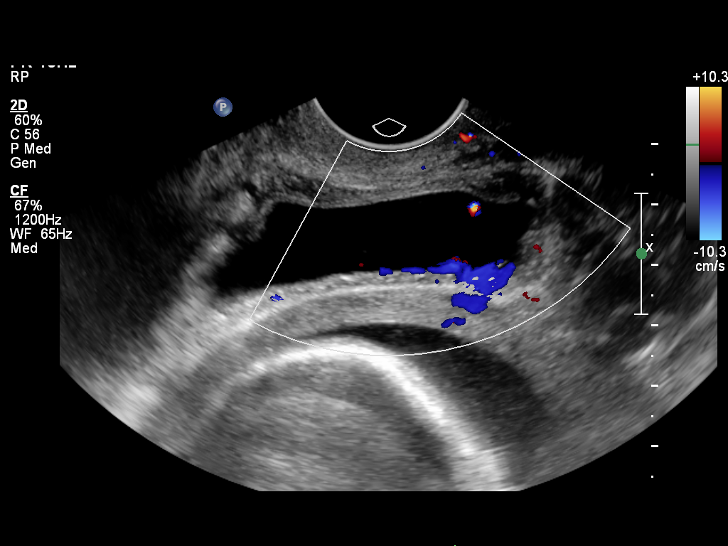

[14 of 25 positions shown; findings below may reference images not displayed]

FINDINGS: Right Kidney:  12.5 cm Persistent moderate right
hydroureteronephrosis with ureterectasis to the most distal
visualized proximal right ureter measuring 0.9 cm. No echogenic
renal calculus identified.

Left Kidney:  12.2 cm.  Normal.

Bladder:  Normal.  Bilateral ureteral jets visualized.  No
intravesical calculus identified.

At transvaginal imaging, no distal ureteral/ureterovesicular
junction calculus was visualized.
IMPRESSION: Persistent moderate proximal right hydroureteronephrosis.  No
distal ureteral calculus could be visualized with transvaginal
technique.  Although the presence of bilateral ureteral jets
suggests lack of complete obstruction, partial obstruction is
possible by a non passed stone.

## 2014-09-16 ENCOUNTER — Encounter (HOSPITAL_COMMUNITY): Payer: Self-pay

## 2015-11-27 ENCOUNTER — Ambulatory Visit (INDEPENDENT_AMBULATORY_CARE_PROVIDER_SITE_OTHER): Payer: BC Managed Care – PPO | Admitting: Family Medicine

## 2015-11-27 ENCOUNTER — Encounter: Payer: Self-pay | Admitting: Family Medicine

## 2015-11-27 VITALS — BP 126/78 | HR 82 | Temp 97.7°F | Ht 64.0 in | Wt 170.5 lb

## 2015-11-27 DIAGNOSIS — Z0000 Encounter for general adult medical examination without abnormal findings: Secondary | ICD-10-CM | POA: Diagnosis not present

## 2015-11-27 DIAGNOSIS — H66001 Acute suppurative otitis media without spontaneous rupture of ear drum, right ear: Secondary | ICD-10-CM

## 2015-11-27 DIAGNOSIS — E559 Vitamin D deficiency, unspecified: Secondary | ICD-10-CM

## 2015-11-27 MED ORDER — AMOXICILLIN-POT CLAVULANATE 875-125 MG PO TABS: 1.0000 | ORAL_TABLET | Freq: Two times a day (BID) | ORAL | Status: DC

## 2015-11-27 NOTE — Progress Notes (Signed)
Pre visit review using our clinic review tool, if applicable. No additional management support is needed unless otherwise documented below in the visit note. 

## 2015-11-27 NOTE — Patient Instructions (Signed)
Take the antibiotic as prescribed.  Use OTC Sudafed as well (if needed).  Follow up annually (sooner if needed).  Take care  Dr. Adriana Simasook

## 2015-11-28 ENCOUNTER — Encounter: Payer: Self-pay | Admitting: Family Medicine

## 2015-11-28 DIAGNOSIS — Z0000 Encounter for general adult medical examination without abnormal findings: Secondary | ICD-10-CM | POA: Insufficient documentation

## 2015-11-28 DIAGNOSIS — H6690 Otitis media, unspecified, unspecified ear: Secondary | ICD-10-CM | POA: Insufficient documentation

## 2015-11-28 DIAGNOSIS — E559 Vitamin D deficiency, unspecified: Secondary | ICD-10-CM | POA: Insufficient documentation

## 2015-11-28 NOTE — Assessment & Plan Note (Signed)
Patient currently on treatment from Gyn.

## 2015-11-28 NOTE — Assessment & Plan Note (Signed)
Pap smear up to date. Declined flu. Tdap up to date.  Advised prenatal as she is not on birth control.  Will obtain labs from Gyn.

## 2015-11-28 NOTE — Progress Notes (Signed)
Subjective:  Patient ID: Deborah King, female    DOB: 09/06/1981  Age: 35 y.o. MRN: [id number]  CC: Establish care, Ear pain  HPI Deborah King is a 35 y.o. female presents to the clinic today to establish care.  Preventative Healthcare  Pap smear: Done in Nov 2016 (Gyn Cherly Hensen).  Mammogram: N/A.  Colonoscopy: N/A.  Immunizations  Tetanus - Up to date; done in 2014.  Flu - Declines.   Labs: Had labs earlier in 2016.  Alcohol use: No.  Smoking/tobacco use: No.  STD/HIV testing: Already done during pregnancy in 2014.  Regular dental exams: Yes.   Wears seat belt: Yes.  Ear pain  Started Monday.  Right ear.  Seen at local urgent care and ear was flushed. No other intervention.  She continues to have severe pain of the right ear.  No exacerbating or relieving factors.  No other associated symptoms.   PMH, Surgical Hx, Family Hx, Social History reviewed and updated as below.  Past Medical History  Diagnosis Date  . History of kidney stones     During pregnancy  . History of gestational diabetes    Past Surgical History  Procedure Laterality Date  . No past surgeries     Family History  Problem Relation Age of Onset  . Thyroid disease Mother   . Arthritis Mother   . Hyperlipidemia Mother   . Hypertension Father   . Arthritis Father   . Hyperlipidemia Father   . Cancer Maternal Grandmother     endometrial cancer  . Heart disease Maternal Grandfather   . Hypertension Paternal Grandmother   . Arthritis Paternal Grandmother   . Cancer Paternal Grandmother   . Seizures Paternal Grandfather    Social History  Substance Use Topics  . Smoking status: Never Smoker   . Smokeless tobacco: Never Used  . Alcohol Use: No   Review of Systems  HENT: Positive for ear pain.   All other systems reviewed and are negative.  Objective:   Today's Vitals: BP 126/78 mmHg  Pulse 82  Temp(Src) 97.7 F (36.5 C) (Oral)  Ht 5\' 4"  (1.626 m)  Wt 170 lb 8 oz  (77.338 kg)  BMI 29.25 kg/m2  SpO2 97%  LMP 11/12/2015  Physical Exam  Constitutional: She is oriented to person, place, and time. She appears well-developed. No distress.  HENT:  Head: Normocephalic and atraumatic.  Mouth/Throat: Oropharynx is clear and moist. No oropharyngeal exudate.  R TM with bulging and erythema.  Eyes: Conjunctivae are normal. No scleral icterus.  Neck: Neck supple.  Cardiovascular: Normal rate and regular rhythm.   No murmur heard. Pulmonary/Chest: Effort normal and breath sounds normal. She has no wheezes. She has no rales.  Abdominal: Soft. She exhibits no distension. There is no tenderness. There is no rebound and no guarding.  Musculoskeletal: Normal range of motion. She exhibits no edema.  Lymphadenopathy:    She has no cervical adenopathy.  Neurological: She is alert and oriented to person, place, and time.  Skin: Skin is warm and dry. No rash noted.  Psychiatric: She has a normal mood and affect.  Vitals reviewed.  Assessment & Plan:   Problem List Items Addressed This Visit    Vitamin D deficiency    Patient currently on treatment from Gyn.      Preventative health care - Primary    Pap smear up to date. Declined flu. Tdap up to date.  Advised prenatal as she is not on birth control.  Will obtain labs from Gyn.      Otitis media    New problem. Treating with Augmentin.      Relevant Medications   amoxicillin-clavulanate (AUGMENTIN) 875-125 MG tablet      Outpatient Encounter Prescriptions as of 11/27/2015  Medication Sig  . Vitamin D, Ergocalciferol, (DRISDOL) 50000 units CAPS capsule Take 50,000 Units by mouth every 7 (seven) days.  Marland Kitchen. amoxicillin-clavulanate (AUGMENTIN) 875-125 MG tablet Take 1 tablet by mouth 2 (two) times daily.  . Prenatal Vit-Fe Fumarate-FA (PRENATAL MULTIVITAMIN) TABS Take 1 tablet by mouth at bedtime. Reported on 11/27/2015  . [DISCONTINUED] ibuprofen (ADVIL,MOTRIN) 600 MG tablet Take 1 tablet (600 mg total)  by mouth every 6 (six) hours. (Patient not taking: Reported on 11/27/2015)  . [DISCONTINUED] iron polysaccharides (NIFEREX) 150 MG capsule Take 1 capsule (150 mg total) by mouth 2 (two) times daily. (Patient not taking: Reported on 11/27/2015)  . [DISCONTINUED] oxyCODONE-acetaminophen (PERCOCET/ROXICET) 5-325 MG per tablet Take 1-2 tablets by mouth every 6 (six) hours as needed. (Patient not taking: Reported on 11/27/2015)   No facility-administered encounter medications on file as of 11/27/2015.    Follow-up: Return in about 1 year (around 11/26/2016), or if symptoms worsen or fail to improve.  Everlene OtherJayce Keno Caraway DO [facility] [place]

## 2015-11-28 NOTE — Assessment & Plan Note (Signed)
New problem. Treating with Augmentin. 

## 2015-12-15 ENCOUNTER — Encounter: Payer: Self-pay | Admitting: Family Medicine

## 2015-12-15 ENCOUNTER — Ambulatory Visit (INDEPENDENT_AMBULATORY_CARE_PROVIDER_SITE_OTHER): Payer: BC Managed Care – PPO | Admitting: Family Medicine

## 2015-12-15 VITALS — BP 112/82 | HR 99 | Temp 98.1°F | Ht 64.0 in | Wt 169.8 lb

## 2015-12-15 DIAGNOSIS — H66001 Acute suppurative otitis media without spontaneous rupture of ear drum, right ear: Secondary | ICD-10-CM | POA: Diagnosis not present

## 2015-12-15 MED ORDER — CEFDINIR 300 MG PO CAPS: 600.0000 mg | ORAL_CAPSULE | Freq: Every day | ORAL | Status: DC

## 2015-12-15 NOTE — Progress Notes (Signed)
   Subjective:  Patient ID: Deborah King, female    DOB: [birth year]  Age: 35 y.o. MRN: [id number]  CC: Right ear pain  HPI:  35 year old female presents with right ear pain.  R Ear Pain  Patient was recently seen on 1/12.  She was diagnosed and treated for otitis media.  She states that she'll improve anabolic therapy but then had recurrence of her pain last week.  She currently has moderate to severe right ear pain.  No exacerbating or relieving factors.  No interventions tried.  No associated fevers or chills.  Social Hx   Social History   Social History  . Marital Status: Married    Spouse Name: N/A  . Number of Children: N/A  . Years of Education: N/A   Social History Main Topics  . Smoking status: Never Smoker   . Smokeless tobacco: Never Used  . Alcohol Use: No  . Drug Use: No  . Sexual Activity: Yes    Birth Control/ Protection: None   Other Topics Concern  . None   Social History Narrative   Review of Systems  Constitutional: Negative for fever and chills.  HENT: Positive for ear pain.    Objective:  BP 112/82 mmHg  Pulse 99  Temp(Src) 98.1 F (36.7 C) (Oral)  Ht  (1.626 m)  Wt 169 lb 12 oz (76.998 kg)  BMI 29.12 kg/m2  SpO2 99%  LMP 11/12/2015  BP/Weight 12/15/2015 11/27/2015 07/25/2013  Systolic BP 112 126 106  Diastolic BP 82 78 66  Wt. (Lbs) 169.75 170.5 -  BMI 29.12 29.25 -   Physical Exam  Constitutional: She is oriented to person, place, and time. She appears well-developed. No distress.  HENT:  Head: Normocephalic and atraumatic.  Mouth/Throat: Oropharynx is clear and moist.  Left TM normal. R TM was initially obscured by cerumen. After irrigation, TM was visible. Dull. Poor light reflex. No erythema.   Pulmonary/Chest: Effort normal.  Neurological: She is alert and oriented to person, place, and time.  Psychiatric: She has a normal mood and affect.  Vitals reviewed.  Lab Results  Component Value Date   WBC 11.4*  07/25/2013   HGB 7.7* 07/25/2013   HCT 25.0* 07/25/2013   PLT 145* 07/25/2013   Assessment & Plan:   Problem List Items Addressed This Visit    Otitis media - Primary    Patient with recent otitis media. She has had a recurrence of pain, ? Failed treatment. TM dull and with poor light reflex. Treating with Omnicef. If not improvement, with refer to ENT.      Relevant Medications   cefdinir (OMNICEF) 300 MG capsule      Meds ordered this encounter  Medications  . cefdinir (OMNICEF) 300 MG capsule    Sig: Take 2 capsules (600 mg total) by mouth daily.    Dispense:  10 capsule    Refill:  0    Follow-up: PRN  Everlene Other DO [facility]

## 2015-12-15 NOTE — Progress Notes (Signed)
Pre visit review using our clinic review tool, if applicable. No additional management support is needed unless otherwise documented below in the visit note. 

## 2015-12-15 NOTE — Assessment & Plan Note (Signed)
Patient with recent otitis media. She has had a recurrence of pain, ? Failed treatment. TM dull and with poor light reflex. Treating with Omnicef. If not improvement, with refer to ENT.

## 2015-12-15 NOTE — Patient Instructions (Signed)
Call me if you fail to improve.  Take the antibiotic as prescribed.  Take care  Dr. Adriana Simas

## 2016-04-09 ENCOUNTER — Encounter: Payer: Self-pay | Admitting: Family Medicine

## 2016-04-09 ENCOUNTER — Ambulatory Visit (INDEPENDENT_AMBULATORY_CARE_PROVIDER_SITE_OTHER): Payer: BC Managed Care – PPO | Admitting: Family Medicine

## 2016-04-09 VITALS — BP 122/74 | HR 71 | Temp 98.3°F | Ht 63.0 in | Wt 171.0 lb

## 2016-04-09 DIAGNOSIS — H109 Unspecified conjunctivitis: Secondary | ICD-10-CM | POA: Insufficient documentation

## 2016-04-09 MED ORDER — POLYMYXIN B-TRIMETHOPRIM 10000-0.1 UNIT/ML-% OP SOLN: 1.0000 [drp] | Freq: Four times a day (QID) | OPHTHALMIC | Status: DC

## 2016-04-09 NOTE — Patient Instructions (Addendum)
Take medication 4 times daily as prescribed.  Continue for 5-7 days.  Take care  Dr. Adriana Simasook

## 2016-04-09 NOTE — Assessment & Plan Note (Signed)
New problem. Given history and exam, treating empirically for bacterial source with Polytrim.

## 2016-04-09 NOTE — Progress Notes (Signed)
   Subjective:  Patient ID: Deborah King, female    DOB: 02/20/1981  Age: 35 y.o. MRN: [id number]  CC: ? Pink eye  HPI:  35 year old female presents to clinic today for an acute visit with the above complaint.  Patient is concerned that she has pinkeye. She states that it began last night. She's had redness and drainage from her right eye. No changes in her vision. She states that her eye was crusted shut this morning. No known exacerbating or relieving factors. 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 However she does not recall anyone who has had pinkeye recently.  Social Hx   Social History   Social History  . Marital Status: Married    Spouse Name: N/A  . Number of Children: N/A  . Years of Education: N/A   Social History Main Topics  . Smoking status: Never Smoker   . Smokeless tobacco: Never Used  . Alcohol Use: No  . Drug Use: No  . Sexual Activity: Yes    Birth Control/ Protection: None   Other Topics Concern  . None   Social History Narrative   Review of Systems  Constitutional: Negative.   Eyes: Positive for discharge and redness.   Objective:  BP 122/74 mmHg  Pulse 71  Temp(Src) 98.3 F (36.8 C) (Oral)  Ht 5\' 3"  (1.6 m)  Wt 171 lb (77.565 kg)  BMI 30.30 kg/m2  SpO2 98%  LMP 04/01/2016  Breastfeeding? No  BP/Weight 04/09/2016 12/15/2015 11/27/2015  Systolic BP 122 112 126  Diastolic BP 74 82 78  Wt. (Lbs) 171 169.75 170.5  BMI 30.3 29.12 29.25   Physical Exam  Constitutional: She is oriented to person, place, and time. She appears well-developed. No distress.  HENT:  Head: Normocephalic and atraumatic.  Eyes:  Right eye with conjunctival injection. No apparent discharge.  Pulmonary/Chest: Effort normal.  Neurological: She is alert and oriented to person, place, and time.  Psychiatric: She has a normal mood and affect.  Vitals reviewed.  Assessment & Plan:   Problem List Items Addressed This Visit    Conjunctivitis  - Primary    New problem. Given history and exam, treating empirically for bacterial source with Polytrim.         Meds ordered this encounter  Medications  . trimethoprim-polymyxin b (POLYTRIM) ophthalmic solution    Sig: Place 1 drop into the right eye every 6 (six) hours.    Dispense:  10 mL    Refill:  1    Follow-up: Return if symptoms worsen or fail to improve.  Everlene OtherJayce Rustin Erhart DO [facility] [place]

## 2017-12-23 ENCOUNTER — Ambulatory Visit: Payer: Self-pay | Admitting: *Deleted

## 2017-12-23 NOTE — Telephone Encounter (Signed)
Spoke to patient and she states she has be under a lot of stress at work .  She states  Increased  feelings of down depressed and hopelessness.   Denies thoughts of harming herself or others.   Under a lot of stress at work.   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 She has never been on antidepressant at work. Scheduled for appointment on 12/26/17 with Dr Shirlee LatchMclean . Advised patient if symptoms worsen to seek medical attention at ED.

## 2017-12-23 NOTE — Telephone Encounter (Addendum)
Pt states that she "has been feeling stressed for the past few months but has been the worst since January"; she denies suicidal thoughts or harming others; nurse triage initiated,and per nurse triage recommendation made for pt to see physician within 3 days; [facility] or [facility]; [facility] Baxter [facility] and she will check for availability; will also [facility] for [facility]; please call pt at [phone]; [facility], pt scheduled with Dr Mclean-Scocuzza 12/26/17 at 1000.   Reason for Disposition . [1] Symptoms of anxiety or panic AND [2] has not been evaluated for this by physician  Answer Assessment - Initial Assessment Questions 1. CONCERN: "What happened that made you call today?"     "I cry all day at school" 2. ANXIETY SYMPTOM SCREENING: "Can you describe how you have been feeling?"  (e.g., tense, restless, panicky, anxious, keyed up, trouble sleeping, trouble concentrating)     panicy at school 3. ONSET: "How long have you been feeling this way?"    months 4. RECURRENT: "Have you felt this way before?"  If yes: "What happened that time?" "What helped these feelings go away in the past?"      no 5. RISK OF HARM - SUICIDAL IDEATION:  "Do you ever have thoughts of hurting or killing yourself?"  (e.g., yes, no, no but preoccupation with thoughts about death)   - INTENT:  "Do you have thoughts of hurting or killing yourself right NOW?" (e.g., yes, no, N/A)   - PLAN: "Do you have a specific plan for how you would do this?" (e.g., gun, knife, overdose, no plan, N/A)     no 6. RISK OF HARM - HOMICIDAL IDEATION:  "Do you ever have thoughts of hurting or killing someone else?"  (e.g., yes, no, no but preoccupation with thoughts about death)   - INTENT:  "Do you have thoughts of hurting or killing someone right NOW?" (e.g., yes, no, N/A)   - PLAN: "Do you have a specific plan for how you would do this?" (e.g., gun,  knife, no plan, N/A)      no 7. FUNCTIONAL IMPAIRMENT: "How have things been going for you overall in your life? Have you had any more difficulties than usual doing your normal daily activities?"  (e.g., better, same, worse; self-care, school, work, interactions)     work 8. SUPPORT: "Who is with you now?" "Who do you live with?" "Do you have family or friends nearby who you can talk to?"      At school; lives at home with family  9. THERAPIST: "Do you have a counselor or therapist? Name?"     no 10. STRESSORS: "Has there been any new stress or recent changes in your life?"       no 11. CAFFEINE ABUSE: "Do you drink caffeinated beverages, and how much each day?" (e.g., coffee, tea, colas)       Coffee 1-2 cups daily 12. SUBSTANCE ABUSE: "Do you use any illegal drugs or alcohol?"       no 13. OTHER SYMPTOMS: "Do you have any other physical symptoms right now?" (e.g., chest pain, palpitations, difficulty breathing, fever)       headaches 14. PREGNANCY: "Is there any chance you are pregnant?" "When was your last menstrual period?"      No,LMP 3rd week of January 2018  Protocols used: ANXIETY AND PANIC ATTACK-A-AH

## 2017-12-23 NOTE — Telephone Encounter (Signed)
Charted in error.

## 2017-12-26 ENCOUNTER — Ambulatory Visit: Payer: BC Managed Care – PPO | Admitting: Internal Medicine

## 2017-12-26 ENCOUNTER — Encounter: Payer: Self-pay | Admitting: Internal Medicine

## 2017-12-26 VITALS — BP 144/84 | HR 81 | Temp 98.4°F | Ht 63.0 in | Wt 168.8 lb

## 2017-12-26 DIAGNOSIS — Z13220 Encounter for screening for lipoid disorders: Secondary | ICD-10-CM

## 2017-12-26 DIAGNOSIS — E559 Vitamin D deficiency, unspecified: Secondary | ICD-10-CM | POA: Diagnosis not present

## 2017-12-26 DIAGNOSIS — F32A Depression, unspecified: Secondary | ICD-10-CM | POA: Insufficient documentation

## 2017-12-26 DIAGNOSIS — D649 Anemia, unspecified: Secondary | ICD-10-CM | POA: Diagnosis not present

## 2017-12-26 DIAGNOSIS — Z1329 Encounter for screening for other suspected endocrine disorder: Secondary | ICD-10-CM | POA: Diagnosis not present

## 2017-12-26 DIAGNOSIS — F419 Anxiety disorder, unspecified: Secondary | ICD-10-CM | POA: Diagnosis not present

## 2017-12-26 DIAGNOSIS — Z1389 Encounter for screening for other disorder: Secondary | ICD-10-CM

## 2017-12-26 DIAGNOSIS — F329 Major depressive disorder, single episode, unspecified: Secondary | ICD-10-CM

## 2017-12-26 MED ORDER — SERTRALINE HCL 50 MG PO TABS: 50.0000 mg | ORAL_TABLET | Freq: Every day | ORAL | 2 refills | Status: DC

## 2017-12-26 NOTE — Progress Notes (Signed)
Pre visit review using our clinic review tool, if applicable. No additional management support is needed unless otherwise documented below in the visit note. 
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
## 2017-12-26 NOTE — Patient Instructions (Signed)
Try Zoloft 50 mg in am   Generalized Anxiety Disorder, Adult Generalized anxiety disorder (GAD) is a mental health disorder. People with this condition constantly worry about everyday events. Unlike normal anxiety, worry related to GAD is not triggered by a specific event. These worries also do not fade or get better with time. GAD interferes with life functions, including relationships, work, and school. GAD can vary from mild to severe. People with severe GAD can have intense waves of anxiety with physical symptoms (panic attacks). What are the causes? The exact cause of GAD is not known. What increases the risk? This condition is more likely to develop in:  Women.  People who have a family history of anxiety disorders.  People who are very shy.  People who experience very stressful life events, such as the death of a loved one.  People who have a very stressful family environment.  What are the signs or symptoms? People with GAD often worry excessively about many things in their lives, such as their health and family. They may also be overly concerned about:  Doing well at work.  Being on time.  Natural disasters.  Friendships.  Physical symptoms of GAD include:  Fatigue.  Muscle tension or having muscle twitches.  Trembling or feeling shaky.  Being easily startled.  Feeling like your heart is pounding or racing.  Feeling out of breath or like you cannot take a deep breath.  Having trouble falling asleep or staying asleep.  Sweating.  Nausea, diarrhea, or irritable bowel syndrome (IBS).  Headaches.  Trouble concentrating or remembering facts.  Restlessness.  Irritability.  How is this diagnosed? Your health care provider can diagnose GAD based on your symptoms and medical history. You will also have a physical exam. The health care provider will ask specific questions about your symptoms, including how severe they are, when they started, and if they come  and go. Your health care provider may ask you about your use of alcohol or drugs, including prescription medicines. Your health care provider may refer you to a mental health specialist for further evaluation. Your health care provider will do a thorough examination and may perform additional tests to rule out other possible causes of your symptoms. To be diagnosed with GAD, a person must have anxiety that:  Is out of his or her control.  Affects several different aspects of his or her life, such as work and relationships.  Causes distress that makes him or her unable to take part in normal activities.  Includes at least three physical symptoms of GAD, such as restlessness, fatigue, trouble concentrating, irritability, muscle tension, or sleep problems.  Before your health care provider can confirm a diagnosis of GAD, these symptoms must be present more days than they are not, and they must last for six months or longer. How is this treated? The following therapies are usually used to treat GAD:  Medicine. Antidepressant medicine is usually prescribed for long-term daily control. Antianxiety medicines may be added in severe cases, especially when panic attacks occur.  Talk therapy (psychotherapy). Certain types of talk therapy can be helpful in treating GAD by providing support, education, and guidance. Options include: ? Cognitive behavioral therapy (CBT). People learn coping skills and techniques to ease their anxiety. They learn to identify unrealistic or negative thoughts and behaviors and to replace them with positive ones. ? Acceptance and commitment therapy (ACT). This treatment teaches people how to be mindful as a way to cope with unwanted thoughts and  feelings. ? Biofeedback. This process trains you to manage your body's response (physiological response) through breathing techniques and relaxation methods. You will work with a therapist while machines are used to monitor your physical  symptoms.  Stress management techniques. These include yoga, meditation, and exercise.  A mental health specialist can help determine which treatment is best for you. Some people see improvement with one type of therapy. However, other people require a combination of therapies. Follow these instructions at home:  Take over-the-counter and prescription medicines only as told by your health care provider.  Try to maintain a normal routine.  Try to anticipate stressful situations and allow extra time to manage them.  Practice any stress management or self-calming techniques as taught by your health care provider.  Do not punish yourself for setbacks or for not making progress.  Try to recognize your accomplishments, even if they are small.  Keep all follow-up visits as told by your health care provider. This is important. Contact a health care provider if:  Your symptoms do not get better.  Your symptoms get worse.  You have signs of depression, such as: ? A persistently sad, cranky, or irritable mood. ? Loss of enjoyment in activities that used to bring you joy. ? Change in weight or eating. ? Changes in sleeping habits. ? Avoiding friends or family members. ? Loss of energy for normal tasks. ? Feelings of guilt or worthlessness. Get help right away if:  You have serious thoughts about hurting yourself or others. If you ever feel like you may hurt yourself or others, or have thoughts about taking your own life, get help right away. You can go to your nearest emergency department or call:  Your local emergency services (911 in the [place]  A suicide crisis helpline, such as the National Suicide Prevention Lifeline at 5185703987. This is open 24 hours a day.  Summary  Generalized anxiety disorder (GAD) is a mental health disorder that involves worry that is not triggered by a specific event.  People with GAD often worry excessively about many things in their lives, such  as their health and family.  GAD may cause physical symptoms such as restlessness, trouble concentrating, sleep problems, frequent sweating, nausea, diarrhea, headaches, and trembling or muscle twitching.  A mental health specialist can help determine which treatment is best for you. Some people see improvement with one type of therapy. However, other people require a combination of therapies. This information is not intended to replace advice given to you by your health care provider. Make sure you discuss any questions you have with your health care provider. Document Released: 02/26/2013 Document Revised: 09/21/2016 Document Reviewed: 09/21/2016 Elsevier Interactive Patient Education  [employer].

## 2017-12-27 LAB — COMPREHENSIVE METABOLIC PANEL
AG Ratio: 1.8 (calc) (ref 1.0–2.5)
ALBUMIN MSPROF: 4.3 g/dL (ref 3.6–5.1)
ALKALINE PHOSPHATASE (APISO): 70 U/L (ref 33–115)
ALT: 10 U/L (ref 6–29)
AST: 17 U/L (ref 10–30)
BUN: 10 mg/dL (ref 7–25)
CO2: 19 mmol/L — ABNORMAL LOW (ref 20–32)
CREATININE: 0.67 mg/dL (ref 0.50–1.10)
Calcium: 8.9 mg/dL (ref 8.6–10.2)
Chloride: 111 mmol/L — ABNORMAL HIGH (ref 98–110)
GLUCOSE: 94 mg/dL (ref 65–99)
Globulin: 2.4 g/dL (calc) (ref 1.9–3.7)
POTASSIUM: 4.6 mmol/L (ref 3.5–5.3)
Sodium: 139 mmol/L (ref 135–146)
TOTAL PROTEIN: 6.7 g/dL (ref 6.1–8.1)
Total Bilirubin: 0.5 mg/dL (ref 0.2–1.2)

## 2017-12-27 LAB — LIPID PANEL
Cholesterol: 146 mg/dL (ref <200)
HDL: 59 mg/dL (ref >50)
LDL CHOLESTEROL (CALC): 75 mg/dL
NON-HDL CHOLESTEROL (CALC): 87 mg/dL (ref <130)
TRIGLYCERIDES: 50 mg/dL (ref <150)
Total CHOL/HDL Ratio: 2.5 (calc) (ref <5.0)

## 2017-12-27 LAB — URINALYSIS, ROUTINE W REFLEX MICROSCOPIC
Bilirubin Urine: NEGATIVE
GLUCOSE, UA: NEGATIVE
Hgb urine dipstick: NEGATIVE
Ketones, ur: NEGATIVE
LEUKOCYTES UA: NEGATIVE
NITRITE: NEGATIVE
PH: 8 (ref 5.0–8.0)
Protein, ur: NEGATIVE
SPECIFIC GRAVITY, URINE: 1.026 (ref 1.001–1.03)

## 2017-12-27 LAB — TSH: TSH: 5.84 mIU/L — ABNORMAL HIGH

## 2017-12-27 LAB — CBC WITH DIFFERENTIAL/PLATELET
BASOS PCT: 0.5 %
Basophils Absolute: 33 cells/uL (ref 0–200)
Eosinophils Absolute: 33 cells/uL (ref 15–500)
Eosinophils Relative: 0.5 %
HEMATOCRIT: 37.2 % (ref 35.0–45.0)
Hemoglobin: 12 g/dL (ref 11.7–15.5)
LYMPHS ABS: 1815 {cells}/uL (ref 850–3900)
MCH: 26 pg — ABNORMAL LOW (ref 27.0–33.0)
MCHC: 32.3 g/dL (ref 32.0–36.0)
MCV: 80.5 fL (ref 80.0–100.0)
MPV: 12.2 fL (ref 7.5–12.5)
Monocytes Relative: 7.6 %
Neutro Abs: 4217 cells/uL (ref 1500–7800)
Neutrophils Relative %: 63.9 %
Platelets: 234 10*3/uL (ref 140–400)
RBC: 4.62 10*6/uL (ref 3.80–5.10)
RDW: 12.9 % (ref 11.0–15.0)
Total Lymphocyte: 27.5 %
WBC mixed population: 502 cells/uL (ref 200–950)
WBC: 6.6 10*3/uL (ref 3.8–10.8)

## 2017-12-27 LAB — IRON,TIBC AND FERRITIN PANEL
%SAT: 14 % (calc) (ref 11–50)
Ferritin: 5 ng/mL — ABNORMAL LOW (ref 10–154)
Iron: 63 ug/dL (ref 40–190)
TIBC: 446 mcg/dL (calc) (ref 250–450)

## 2017-12-27 LAB — T4, FREE: FREE T4: 0.8 ng/dL (ref 0.8–1.8)

## 2017-12-27 LAB — VITAMIN D 25 HYDROXY (VIT D DEFICIENCY, FRACTURES): Vit D, 25-Hydroxy: 17 ng/mL — ABNORMAL LOW (ref 30–100)

## 2017-12-28 ENCOUNTER — Other Ambulatory Visit: Payer: Self-pay | Admitting: Internal Medicine

## 2017-12-28 DIAGNOSIS — E559 Vitamin D deficiency, unspecified: Secondary | ICD-10-CM

## 2017-12-28 DIAGNOSIS — R946 Abnormal results of thyroid function studies: Secondary | ICD-10-CM

## 2017-12-28 MED ORDER — CHOLECALCIFEROL 1.25 MG (50000 UT) PO CAPS: 50000.0000 [IU] | ORAL_CAPSULE | ORAL | 1 refills | Status: DC

## 2018-01-02 ENCOUNTER — Other Ambulatory Visit (INDEPENDENT_AMBULATORY_CARE_PROVIDER_SITE_OTHER): Payer: BC Managed Care – PPO

## 2018-01-02 DIAGNOSIS — R946 Abnormal results of thyroid function studies: Secondary | ICD-10-CM | POA: Diagnosis not present

## 2018-01-02 NOTE — Addendum Note (Signed)
Addended by: Penne LashWIGGINS, Laronda Lisby N on: 01/02/2018 11:05 AM   Modules accepted: Orders

## 2018-01-03 LAB — T3, FREE: T3, Free: 2.9 pg/mL (ref 2.3–4.2)

## 2018-01-03 LAB — THYROID PEROXIDASE ANTIBODY: THYROID PEROXIDASE ANTIBODY: 51 [IU]/mL — AB (ref <9)

## 2018-01-05 ENCOUNTER — Other Ambulatory Visit: Payer: Self-pay | Admitting: Internal Medicine

## 2018-01-05 DIAGNOSIS — E038 Other specified hypothyroidism: Secondary | ICD-10-CM

## 2018-01-05 DIAGNOSIS — E063 Autoimmune thyroiditis: Principal | ICD-10-CM

## 2018-01-05 MED ORDER — LEVOTHYROXINE SODIUM 25 MCG PO CAPS: 1.0000 | ORAL_CAPSULE | Freq: Every day | ORAL | 1 refills | Status: DC

## 2018-01-09 ENCOUNTER — Encounter: Payer: Self-pay | Admitting: Internal Medicine

## 2018-01-09 ENCOUNTER — Ambulatory Visit: Payer: Self-pay | Admitting: *Deleted

## 2018-01-09 ENCOUNTER — Telehealth: Payer: Self-pay | Admitting: Internal Medicine

## 2018-01-09 NOTE — Telephone Encounter (Signed)
Encounter  Created  To  Triage  Encounter   

## 2018-01-09 NOTE — Telephone Encounter (Signed)
Pt  Seen  Several  Weeks  Ago   Placed  On    Meds.  Continues  To  Have  Stressors   At  Work.    Denies  Any  Suicidal  Ideations    Denies   Ant  Thoughts  Of hurting  Herself  Or  Others .  Told  To call back   If  Any problems .    Appt  Made  For  Wednesday  With  Dr Shirlee LatchMclean  Deborah Humphrey-  King      For  Wednesday  At  930  Am

## 2018-01-09 NOTE — Telephone Encounter (Signed)
Copied from CRM [id number]. Topic: Inquiry >> Jan 09, 2018  3:14 PM Alexander BergeronBarksdale, Harvey B wrote: Reason for CRM: pt called b/c she states she is having strong anxiety @ work and is wondering if she could be written out of work, pt states the medication of sertraline (ZOLOFT) 50 MG tablet [[id number]][[id number]]  is helping but she has anxiety to even approach the door of her employer to go in, contact pt to advise

## 2018-01-09 NOTE — Telephone Encounter (Signed)
  Reason for Disposition . [1] Symptoms of anxiety or panic attack AND [2] is a chronic symptom (recurrent or ongoing AND present > 4 weeks)  Answer Assessment - Initial Assessment Questions 1. CONCERN: "What happened that made you call today?"      Still    Anxious  About  Having  To  Go  Back to  Work    2. ANXIETY SYMPTOM SCREENING: "Can you describe how you have been feeling?"  (e.g., tense, restless, panicky, anxious, keyed up, trouble sleeping, trouble concentrating)      Anxious  About   Work    Education officer, communityTrouble   Sleeping  At times       3. ONSET: "How long have you been feeling this way?"       sev   Months    4. RECURRENT: "Have you felt this way before?"  If yes: "What happened that time?" "What helped these feelings go away in the past?"        Yes   Pt states  It is  Related  To   Stress  At  Work  - Seen by  DR  Shirlee LatchMCLEAN - Scocuzza   sev  Weeks  Ago  And  Placed  On  zoloft  Some   5. RISK OF HARM - SUICIDAL IDEATION:  "Do you ever have thoughts of hurting or killing yourself?"  (e.g., yes, no, no but preoccupation with thoughts about death)no   - INTENT:  "Do you have thoughts of hurting or killing yourself right NOW?" (e.g., yes, no, N/A) no   - PLAN: "Do you have a specific plan for how you would do this?" (e.g., gun, knife, overdose, no plan, N/A)     no 6. RISK OF HARM - HOMICIDAL IDEATION:  "Do you ever have thoughts of hurting or killing someone else?"  (e.g., yes, no, no but preoccupation with thoughts about death)no   - INTENT:  "Do you have thoughts of hurting or killing someone right NOW?" (e.g., yes, no, N/A) no   - PLAN: "Do you have a specific plan for how you would do this?" (e.g., gun, knife, no plan, N/A)      no 7. FUNCTIONAL IMPAIRMENT: "How have things been going for you overall in your life? Have you had any more difficulties than usual doing your normal daily activities?"  (e.g., better, same, worse; self-care, school, work, interactions)     Work   8. SUPPORT: "Who is  with you now?" "Who do you live with?" "Do you have family or friends nearby who you can talk to?"        Yes  9. THERAPIST: "Do you have a counselor or therapist? Name?"      No 10. STRESSORS: "Has there been any new stress or recent changes in your life?"       Work  Related   11. CAFFEINE ABUSE: "Do you drink caffeinated beverages, and how much each day?" (e.g., coffee, tea, colas)         No 12. SUBSTANCE ABUSE: "Do you use any illegal drugs or alcohol?"       No 13. OTHER SYMPTOMS: "Do you have any other physical symptoms right now?" (e.g., chest pain, palpitations, difficulty breathing, fever)        No 14. PREGNANCY: "Is there any chance you are pregnant?" "When was your last menstrual period?"        Last  Week  Protocols used: ANXIETY AND PANIC ATTACK-A-AH

## 2018-01-11 ENCOUNTER — Ambulatory Visit: Payer: BC Managed Care – PPO | Admitting: Internal Medicine

## 2018-01-11 ENCOUNTER — Encounter: Payer: Self-pay | Admitting: Internal Medicine

## 2018-01-11 VITALS — BP 116/80 | HR 75 | Temp 98.0°F | Resp 14 | Ht 63.0 in | Wt 164.8 lb

## 2018-01-11 DIAGNOSIS — F419 Anxiety disorder, unspecified: Secondary | ICD-10-CM | POA: Diagnosis not present

## 2018-01-11 DIAGNOSIS — Z23 Encounter for immunization: Secondary | ICD-10-CM | POA: Diagnosis not present

## 2018-01-11 DIAGNOSIS — E559 Vitamin D deficiency, unspecified: Secondary | ICD-10-CM | POA: Diagnosis not present

## 2018-01-11 DIAGNOSIS — E039 Hypothyroidism, unspecified: Secondary | ICD-10-CM

## 2018-01-11 DIAGNOSIS — F329 Major depressive disorder, single episode, unspecified: Secondary | ICD-10-CM

## 2018-01-11 DIAGNOSIS — E038 Other specified hypothyroidism: Secondary | ICD-10-CM | POA: Insufficient documentation

## 2018-01-11 DIAGNOSIS — F32A Depression, unspecified: Secondary | ICD-10-CM
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
## 2018-01-11 NOTE — Patient Instructions (Addendum)
Please try to taper off zoloft when ready 1/2 pill x 1 week then stop  Let me know how you are doing  Take care  F/u in 3 months  Try melatonin 3-6 mg 1 hour before bed, sleepy time tea or warm milk before bed   Generalized Anxiety Disorder, Adult Generalized anxiety disorder (GAD) is a mental health disorder. People with this condition constantly worry about everyday events. Unlike normal anxiety, worry related to GAD is not triggered by a specific event. These worries also do not fade or get better with time. GAD interferes with life functions, including relationships, work, and school. GAD can vary from mild to severe. People with severe GAD can have intense waves of anxiety with physical symptoms (panic attacks). What are the causes? The exact cause of GAD is not known. What increases the risk? This condition is more likely to develop in:  Women.  People who have a family history of anxiety disorders.  People who are very shy.  People who experience very stressful life events, such as the death of a loved one.  People who have a very stressful family environment.  What are the signs or symptoms? People with GAD often worry excessively about many things in their lives, such as their health and family. They may also be overly concerned about:  Doing well at work.  Being on time.  Natural disasters.  Friendships.  Physical symptoms of GAD include:  Fatigue.  Muscle tension or having muscle twitches.  Trembling or feeling shaky.  Being easily startled.  Feeling like your heart is pounding or racing.  Feeling out of breath or like you cannot take a deep breath.  Having trouble falling asleep or staying asleep.  Sweating.  Nausea, diarrhea, or irritable bowel syndrome (IBS).  Headaches.  Trouble concentrating or remembering facts.  Restlessness.  Irritability.  How is this diagnosed? Your health care provider can diagnose GAD based on your symptoms and  medical history. You will also have a physical exam. The health care provider will ask specific questions about your symptoms, including how severe they are, when they started, and if they come and go. Your health care provider may ask you about your use of alcohol or drugs, including prescription medicines. Your health care provider may refer you to a mental health specialist for further evaluation. Your health care provider will do a thorough examination and may perform additional tests to rule out other possible causes of your symptoms. To be diagnosed with GAD, a person must have anxiety that:  Is out of his or her control.  Affects several different aspects of his or her life, such as work and relationships.  Causes distress that makes him or her unable to take part in normal activities.  Includes at least three physical symptoms of GAD, such as restlessness, fatigue, trouble concentrating, irritability, muscle tension, or sleep problems.  Before your health care provider can confirm a diagnosis of GAD, these symptoms must be present more days than they are not, and they must last for six months or longer. How is this treated? The following therapies are usually used to treat GAD:  Medicine. Antidepressant medicine is usually prescribed for long-term daily control. Antianxiety medicines may be added in severe cases, especially when panic attacks occur.  Talk therapy (psychotherapy). Certain types of talk therapy can be helpful in treating GAD by providing support, education, and guidance. Options include: ? Cognitive behavioral therapy (CBT). People learn coping skills and techniques to ease  their anxiety. They learn to identify unrealistic or negative thoughts and behaviors and to replace them with positive ones. ? Acceptance and commitment therapy (ACT). This treatment teaches people how to be mindful as a way to cope with unwanted thoughts and feelings. ? Biofeedback. This process trains  you to manage your body's response (physiological response) through breathing techniques and relaxation methods. You will work with a therapist while machines are used to monitor your physical symptoms.  Stress management techniques. These include yoga, meditation, and exercise.  A mental health specialist can help determine which treatment is best for you. Some people see improvement with one type of therapy. However, other people require a combination of therapies. Follow these instructions at home:  Take over-the-counter and prescription medicines only as told by your health care provider.  Try to maintain a normal routine.  Try to anticipate stressful situations and allow extra time to manage them.  Practice any stress management or self-calming techniques as taught by your health care provider.  Do not punish yourself for setbacks or for not making progress.  Try to recognize your accomplishments, even if they are small.  Keep all follow-up visits as told by your health care provider. This is important. Contact a health care provider if:  Your symptoms do not get better.  Your symptoms get worse.  You have signs of depression, such as: ? A persistently sad, cranky, or irritable mood. ? Loss of enjoyment in activities that used to bring you joy. ? Change in weight or eating. ? Changes in sleeping habits. ? Avoiding friends or family members. ? Loss of energy for normal tasks. ? Feelings of guilt or worthlessness. Get help right away if:  You have serious thoughts about hurting yourself or others. If you ever feel like you may hurt yourself or others, or have thoughts about taking your own life, get help right away. You can go to your nearest emergency department or call:  Your local emergency services (911 in the [place]  A suicide crisis helpline, such as the National Suicide Prevention Lifeline at 279-799-16491-601-748-9046. This is open 24 hours a day.  Summary  Generalized  anxiety disorder (GAD) is a mental health disorder that involves worry that is not triggered by a specific event.  People with GAD often worry excessively about many things in their lives, such as their health and family.  GAD may cause physical symptoms such as restlessness, trouble concentrating, sleep problems, frequent sweating, nausea, diarrhea, headaches, and trembling or muscle twitching.  A mental health specialist can help determine which treatment is best for you. Some people see improvement with one type of therapy. However, other people require a combination of therapies. This information is not intended to replace advice given to you by your health care provider. Make sure you discuss any questions you have with your health care provider. Document Released: 02/26/2013 Document Revised: 09/21/2016 Document Reviewed: 09/21/2016 Elsevier Interactive Patient Education  [employer]2018 Elsevier Inc.

## 2018-01-27 ENCOUNTER — Telehealth: Payer: Self-pay | Admitting: Internal Medicine

## 2018-01-27 ENCOUNTER — Ambulatory Visit: Payer: BC Managed Care – PPO | Admitting: Internal Medicine

## 2018-01-27 ENCOUNTER — Encounter: Payer: Self-pay | Admitting: Internal Medicine

## 2018-01-27 NOTE — Telephone Encounter (Signed)
Letter ready  TMS 

## 2018-01-27 NOTE — Telephone Encounter (Signed)
Copied from CRM [id number]. 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

## 2018-01-30 ENCOUNTER — Encounter: Payer: Self-pay | Admitting: Internal Medicine

## 2018-01-30 NOTE — Telephone Encounter (Signed)
Patient has been notified to pick up letter at front desk.
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
## 2018-01-30 NOTE — Telephone Encounter (Signed)
I can see her at 430 on 01/31/18.  Certainly this issue should be taking care of through the school as well.
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
## 2018-03-15 ENCOUNTER — Other Ambulatory Visit: Payer: Self-pay | Admitting: Internal Medicine

## 2018-03-15 DIAGNOSIS — F419 Anxiety disorder, unspecified: Secondary | ICD-10-CM

## 2018-03-15 MED ORDER — SERTRALINE HCL 50 MG PO TABS: 50.0000 mg | ORAL_TABLET | Freq: Every day | ORAL | 5 refills | Status: DC

## 2018-04-11 ENCOUNTER — Ambulatory Visit (INDEPENDENT_AMBULATORY_CARE_PROVIDER_SITE_OTHER): Payer: 59 | Admitting: Internal Medicine

## 2018-04-11 ENCOUNTER — Encounter: Payer: Self-pay | Admitting: Internal Medicine

## 2018-04-11 ENCOUNTER — Other Ambulatory Visit: Payer: Self-pay | Admitting: Internal Medicine

## 2018-04-11 VITALS — BP 108/66 | HR 57 | Temp 98.4°F | Ht 63.0 in | Wt 172.2 lb

## 2018-04-11 DIAGNOSIS — E063 Autoimmune thyroiditis: Principal | ICD-10-CM

## 2018-04-11 DIAGNOSIS — E038 Other specified hypothyroidism: Secondary | ICD-10-CM

## 2018-04-11 DIAGNOSIS — F32A Depression, unspecified: Secondary | ICD-10-CM

## 2018-04-11 DIAGNOSIS — F329 Major depressive disorder, single episode, unspecified: Secondary | ICD-10-CM | POA: Diagnosis not present

## 2018-04-11 DIAGNOSIS — E559 Vitamin D deficiency, unspecified: Secondary | ICD-10-CM

## 2018-04-11 DIAGNOSIS — F419 Anxiety disorder, unspecified: Secondary | ICD-10-CM | POA: Diagnosis not present

## 2018-04-11 DIAGNOSIS — E039 Hypothyroidism, unspecified: Secondary | ICD-10-CM

## 2018-04-11 LAB — TSH: TSH: 5.37 u[IU]/mL — AB (ref 0.35–4.50)

## 2018-04-11 MED ORDER — LEVOTHYROXINE SODIUM 50 MCG PO TABS: 50.0000 ug | ORAL_TABLET | Freq: Every day | ORAL | 1 refills | Status: DC

## 2018-04-11 NOTE — Patient Instructions (Signed)
Start vitamin D3 5000 IU daily  F/u mid 12/2018   Vitamin D Deficiency Vitamin D deficiency is when your body does not have enough vitamin D. Vitamin D is important to your body for many reasons:  It helps the body to absorb two important minerals, called calcium and phosphorus.  It plays a role in bone health.  It may help to prevent some diseases, such as diabetes and multiple sclerosis.  It plays a role in muscle function, including heart function.  You can get vitamin D by:  Eating foods that naturally contain vitamin D.  Eating or drinking milk or other dairy products that have vitamin D added to them.  Taking a vitamin D supplement or a multivitamin supplement that contains vitamin D.  Being in the sun. Your body naturally makes vitamin D when your skin is exposed to sunlight. Your body changes the sunlight into a form of the vitamin that the body can use.  If vitamin D deficiency is severe, it can cause a condition in which your bones become soft. In adults, this condition is called osteomalacia. In children, this condition is called rickets. What are the causes? Vitamin D deficiency may be caused by:  Not eating enough foods that contain vitamin D.  Not getting enough sun exposure.  Having certain digestive system diseases that make it difficult for your body to absorb vitamin D. These diseases include Crohn disease, chronic pancreatitis, and cystic fibrosis.  Having a surgery in which a part of the stomach or a part of the small intestine is removed.  Being obese.  Having chronic kidney disease or liver disease.  What increases the risk? This condition is more likely to develop in:  Older people.  People who do not spend much time outdoors.  People who live in a long-term care facility.  People who have had broken bones.  People with weak or thin bones (osteoporosis).  People who have a disease or condition that changes how the body absorbs vitamin  D.  People who have dark skin.  People who take certain medicines, such as steroid medicines or certain seizure medicines.  People who are overweight or obese.  What are the signs or symptoms? In mild cases of vitamin D deficiency, there may not be any symptoms. If the condition is severe, symptoms may include:  Bone pain.  Muscle pain.  Falling often.  Broken bones caused by a minor injury.  How is this diagnosed? This condition is usually diagnosed with a blood test. How is this treated? Treatment for this condition may depend on what caused the condition. Treatment options include:  Taking vitamin D supplements.  Taking a calcium supplement. Your health care provider will suggest what dose is best for you.  Follow these instructions at home:  Take medicines and supplements only as told by your health care provider.  Eat foods that contain vitamin D. Choices include: ? Fortified dairy products, cereals, or juices. Fortified means that vitamin D has been added to the food. Check the label on the package to be sure. ? Fatty fish, such as salmon or trout. ? Eggs. ? Oysters.  Do not use a tanning bed.  Maintain a healthy weight. Lose weight, if needed.  Keep all follow-up visits as told by your health care provider. This is important. Contact a health care provider if:  Your symptoms do not go away.  You feel like throwing up (nausea) or you throw up (vomit).  You have fewer bowel  movements than usual or it is difficult for you to have a bowel movement (constipation). This information is not intended to replace advice given to you by your health care provider. Make sure you discuss any questions you have with your health care provider. Document Released: 01/24/2012 Document Revised: 04/14/2016 Document Reviewed: 03/19/2015 Elsevier Interactive Patient Education  2018 [employer].

## 2018-04-11 NOTE — Progress Notes (Signed)
Pre visit review using our clinic review tool, if applicable. No additional management support is needed unless otherwise documented below in the visit note. 

## 2018-04-11 NOTE — Progress Notes (Signed)
Chief Complaint  Patient presents with  . Follow-up    anxiety   F/u  1. Anxiety better tapered off Zoloft and now today is 1st day off medication. 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 Hypothyroidism on levo 25 mcg qam  3. Vit D def taking D3 weekly   Review of Systems  Constitutional: Negative for weight loss.  HENT: Negative for hearing loss.   Eyes: Negative for blurred vision.  Respiratory: Negative for shortness of breath.   Cardiovascular: Negative for chest pain.  Skin: Negative for rash.  Neurological: Negative for headaches.  Psychiatric/Behavioral: Negative for depression. The patient is not nervous/anxious.    Past Medical History:  Diagnosis Date  . Anemia   . Anxiety   . History of gestational diabetes   . History of kidney stones    During pregnancy  . Iron deficiency   . Vitamin D deficiency    Past Surgical History:  Procedure Laterality Date  . NO PAST SURGERIES     Family History  Problem Relation Age of Onset  . Hypertension Paternal Grandmother   . Arthritis Paternal Grandmother   . Cancer Paternal Grandmother   . Thyroid disease Mother   . Arthritis Mother   . Hyperlipidemia Mother   . Hypertension Father   . Arthritis Father   . Hyperlipidemia Father   . Cancer Maternal Grandmother        endometrial cancer  . Heart disease Maternal Grandfather   . Seizures Paternal Grandfather    Social History   Socioeconomic History  . Marital status: Married    Spouse name: Not on file  . Number of children: Not on file  . Years of education: Not on file  . Highest education level: Not on file  Occupational History  . Not on file  Social Needs  . Financial resource strain: Not on file  . Food insecurity:    Worry: Not on file    Inability: Not on file  . Transportation needs:    Medical: Not on file    Non-medical: Not on file  Tobacco Use  . Smoking  status: Never Smoker  . Smokeless tobacco: Never Used  Substance and Sexual Activity  . Alcohol use: No  . Drug use: No  . Sexual activity: Yes    Birth control/protection: None  Lifestyle  . Physical activity:    Days per week: Not on file    Minutes per session: Not on file  . Stress: Not on file  Relationships  . Social connections:    Talks on phone: Not on file    Gets together: Not on file    Attends religious service: Not on file    Active member of club or organization: Not on file    Attends meetings of clubs or organizations: Not on file    Relationship status: Not on file  . Intimate partner violence:    Fear of current or ex partner: Not on file    Emotionally abused: Not on file    Physically abused: Not on file    Forced sexual activity: Not on file  Other Topics Concern  . 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 Cholecalciferol 50000 units capsule Take 1 capsule (50,000 Units total) by mouth once a week.  . Levothyroxine Sodium 25 MCG CAPS Take 1 capsule (25 mcg total) by mouth daily before breakfast.  . [DISCONTINUED] sertraline (ZOLOFT) 50 MG tablet Take 1 tablet (50 mg total) by mouth daily.   No Known Allergies No results found for this or any previous visit (from the past 2160 hour(s)). Objective  Body mass index is 30.5 kg/m. Wt Readings from Last 3 Encounters:  04/11/18 172 lb 3.2 oz (78.1 kg)  01/11/18 164 lb 12.8 oz (74.8 kg)  12/26/17 168 lb 12.8 oz (76.6 kg)   Temp Readings from Last 3 Encounters:  04/11/18 98.4 F (36.9 C) (Oral)  01/11/18 98 F (36.7 C) (Oral)  12/26/17 98.4 F (36.9 C) (Oral)   BP Readings from Last 3 Encounters:  04/11/18 108/66  01/11/18 116/80  12/26/17 (!) 144/84   Pulse Readings from Last 3  Encounters:  04/11/18 (!) 57  01/11/18 75  12/26/17 81    Physical Exam  Constitutional: She is oriented to person, place, and time. Vital signs are normal. She appears well-developed and well-nourished. She is cooperative.  HENT:  Head: Normocephalic and atraumatic.  Mouth/Throat: Oropharynx is clear and moist and mucous membranes are normal.  Eyes: Pupils are equal, round, and reactive to light. Conjunctivae are normal.  Cardiovascular: Normal rate, regular rhythm and normal heart sounds.  Pulmonary/Chest: Effort normal and breath sounds normal.  Neurological: She is alert and oriented to person, place, and time. Gait normal.  Skin: Skin is warm, dry and intact.  Psychiatric: She has a normal mood and affect. Her speech is normal and behavior is normal. Judgment and thought content normal. Cognition and memory are normal.  Nursing note and vitals reviewed.   Assessment   1.anxiety improved  2. Hypothyroidism  3. Vit D def  4. HM Plan  1. Off zoloft today  2. Levo 25 mcg qd check TSH today  3. Cont weekly D3 until 06/2018 then start 5000 IU qd  4.  Declines all vaccines phobia of needles Never had hep B vaccine  Pap with OB/GYN Dr. Garwin Brothers get copy today last had 1.5 years ago per pt Declines MMR check   Provider: Dr. Olivia Mackie McLean-Scocuzza-Internal Medicine

## 2018-04-17 ENCOUNTER — Encounter: Payer: Self-pay | Admitting: Internal Medicine

## 2018-11-01 ENCOUNTER — Other Ambulatory Visit: Payer: Self-pay | Admitting: Internal Medicine

## 2018-11-01 ENCOUNTER — Telehealth: Payer: Self-pay | Admitting: Internal Medicine

## 2018-11-01 DIAGNOSIS — E039 Hypothyroidism, unspecified: Secondary | ICD-10-CM

## 2018-11-01 DIAGNOSIS — Z1389 Encounter for screening for other disorder: Secondary | ICD-10-CM

## 2018-11-01 DIAGNOSIS — Z13220 Encounter for screening for lipoid disorders: Secondary | ICD-10-CM

## 2018-11-01 DIAGNOSIS — Z0000 Encounter for general adult medical examination without abnormal findings: Secondary | ICD-10-CM

## 2018-11-01 DIAGNOSIS — E063 Autoimmune thyroiditis: Principal | ICD-10-CM

## 2018-11-01 DIAGNOSIS — E559 Vitamin D deficiency, unspecified: Secondary | ICD-10-CM

## 2018-11-01 DIAGNOSIS — Z1329 Encounter for screening for other suspected endocrine disorder: Secondary | ICD-10-CM

## 2018-11-01 DIAGNOSIS — E038 Other specified hypothyroidism: Secondary | ICD-10-CM

## 2018-11-01 DIAGNOSIS — E611 Iron deficiency: Secondary | ICD-10-CM

## 2018-11-01 MED ORDER — LEVOTHYROXINE SODIUM 50 MCG PO TABS: 50.0000 ug | ORAL_TABLET | Freq: Every day | ORAL | 1 refills | Status: DC

## 2018-11-01 NOTE — Telephone Encounter (Signed)
Call pt to sch fasting labs by 12/26/18 year labs and f/u with me by 03/2019   TMS

## 2018-11-02 NOTE — Telephone Encounter (Signed)
Left message for patient to return call back. PEC may give information and schedule appointment.

## 2018-12-13 ENCOUNTER — Encounter: Payer: Self-pay | Admitting: Internal Medicine

## 2018-12-13 ENCOUNTER — Ambulatory Visit (INDEPENDENT_AMBULATORY_CARE_PROVIDER_SITE_OTHER): Payer: 59 | Admitting: Internal Medicine

## 2018-12-13 VITALS — BP 134/80 | HR 76 | Temp 98.5°F | Ht 63.0 in | Wt 181.8 lb

## 2018-12-13 DIAGNOSIS — E559 Vitamin D deficiency, unspecified: Secondary | ICD-10-CM

## 2018-12-13 DIAGNOSIS — E038 Other specified hypothyroidism: Secondary | ICD-10-CM

## 2018-12-13 DIAGNOSIS — Z0000 Encounter for general adult medical examination without abnormal findings: Secondary | ICD-10-CM | POA: Diagnosis not present

## 2018-12-13 DIAGNOSIS — E611 Iron deficiency: Secondary | ICD-10-CM | POA: Insufficient documentation

## 2018-12-13 DIAGNOSIS — F419 Anxiety disorder, unspecified: Secondary | ICD-10-CM

## 2018-12-13 DIAGNOSIS — Z1329 Encounter for screening for other suspected endocrine disorder: Secondary | ICD-10-CM

## 2018-12-13 DIAGNOSIS — E039 Hypothyroidism, unspecified: Secondary | ICD-10-CM | POA: Diagnosis not present

## 2018-12-13 NOTE — Progress Notes (Signed)
Pre visit review using our clinic review tool, if applicable. No additional management support is needed unless otherwise documented below in the visit note. 

## 2018-12-13 NOTE — Progress Notes (Signed)
Chief Complaint  Patient presents with  . Follow-up   F/u  1. Hypothyroidism on synthyroid 50 mcg qd no h/a as side effects doing well will check nonfasting labs today  2. 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  HENT: Negative for hearing loss.   Eyes: Negative for blurred vision.  Respiratory: Negative for shortness of breath.   Cardiovascular: Negative for chest pain.  Gastrointestinal: Negative for abdominal pain.  Musculoskeletal: Negative for falls.  Skin: Negative for rash.  Neurological: Negative for headaches.  Psychiatric/Behavioral: Negative for memory loss. The patient is not nervous/anxious.    Past Medical History:  Diagnosis Date  . Anemia   . Anxiety   . History of gestational diabetes   . History of kidney stones    During pregnancy  . Iron deficiency   . Vitamin D deficiency    Past Surgical History:  Procedure Laterality Date  . NO PAST SURGERIES     Family History  Problem Relation Age of Onset  . Hypertension Paternal Grandmother   . Arthritis Paternal Grandmother   . Cancer Paternal Grandmother   . Thyroid disease Mother   . Arthritis Mother   . Hyperlipidemia Mother   . Hypertension Father   . Arthritis Father   . Hyperlipidemia Father   . Cancer Maternal Grandmother        endometrial cancer  . Heart disease Maternal Grandfather   . Seizures Paternal Grandfather    Social History   Socioeconomic History  . Marital status: Married    Spouse name: Not on file  . Number of children: Not on file  . Years of education: Not on file  . Highest education level: Not on file  Occupational History  . Not on file  Social Needs  . Financial resource strain: Not on file  . Food insecurity:    Worry: Not on file    Inability: Not on file  . Transportation needs:    Medical: Not on file    Non-medical: Not on file  Tobacco Use  . Smoking status:  Never Smoker  . Smokeless tobacco: Never Used  Substance and Sexual Activity  . Alcohol use: No  . Drug use: No  . Sexual activity: Yes    Birth control/protection: None  Lifestyle  . Physical activity:    Days per week: Not on file    Minutes per session: Not on file  . Stress: Not on file  Relationships  . Social connections:    Talks on phone: Not on file    Gets together: Not on file    Attends religious service: Not on file    Active member of club or organization: Not on file    Attends meetings of clubs or organizations: Not on file    Relationship status: Not on file  . Intimate partner violence:    Fear of current or ex partner: Not on file    Emotionally abused: Not on file    Physically abused: Not on file    Forced sexual activity: Not on file  Other Topics Concern  . 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  levothyroxine (SYNTHROID, LEVOTHROID) 50 MCG tablet Take 1 tablet (50 mcg total) by mouth daily.   No Known Allergies No results found for this or any previous visit (from the past 2160 hour(s)). Objective  Body mass index is 32.2 kg/m. Wt Readings from Last 3 Encounters:  12/13/18 181 lb 12.8 oz (82.5 kg)  04/11/18 172 lb 3.2 oz (78.1 kg)  01/11/18 164 lb 12.8 oz (74.8 kg)   Temp Readings from Last 3 Encounters:  12/13/18 98.5 F (36.9 C) (Oral)  04/11/18 98.4 F (36.9 C) (Oral)  01/11/18 98 F (36.7 C) (Oral)   BP Readings from Last 3 Encounters:  12/13/18 134/80  04/11/18 108/66  01/11/18 116/80   Pulse Readings from Last 3 Encounters:  12/13/18 76  04/11/18 (!) 57  01/11/18 75    Physical Exam Vitals signs and nursing note reviewed.  Constitutional:      Appearance: Normal appearance. She is well-developed and  well-groomed. She is obese.  HENT:     Head: Normocephalic and atraumatic.     Nose: Nose normal.     Mouth/Throat:     Mouth: Mucous membranes are moist.     Pharynx: Oropharynx is clear.  Eyes:     Conjunctiva/sclera: Conjunctivae normal.     Pupils: Pupils are equal, round, and reactive to light.  Cardiovascular:     Rate and Rhythm: Normal rate and regular rhythm.     Heart sounds: Normal heart sounds. No murmur.  Pulmonary:     Effort: Pulmonary effort is normal.     Breath sounds: Normal breath sounds.  Skin:    General: Skin is warm and dry.  Neurological:     General: No focal deficit present.     Mental Status: She is alert and oriented to person, place, and time. Mental status is at baseline.     Gait: Gait normal.  Psychiatric:        Attention and Perception: Attention and perception normal.        Mood and Affect: Mood and affect normal.        Speech: Speech normal.        Behavior: Behavior normal. Behavior is cooperative.        Thought Content: Thought content normal.        Cognition and Memory: Cognition and memory normal.        Judgment: Judgment normal.     Assessment   1. Hypothyroidism  2. Anxiety improved  3. HM Plan   1. Check TSH and tpo labs today  Cont meds  2. Monitor doing well w/o meds  3.  Declines all vaccines phobia of needles Never had hep B vaccine  Pap with OB/GYN Dr. Garwin Brothers get copy today last had 1.5 years ago per pt -sent request again for pap today  Declines MMR check   Provider: Dr. Olivia Mackie McLean-Scocuzza-Internal Medicine

## 2018-12-13 NOTE — Patient Instructions (Signed)
Hypothyroidism  Hypothyroidism is when the thyroid gland does not make enough of certain hormones (it is underactive). The thyroid gland is a small gland located in the lower front part of the neck, just in front of the windpipe (trachea). This gland makes hormones that help control how the body uses food for energy (metabolism) as well as how the heart and brain function. These hormones also play a role in keeping your bones strong. When the thyroid is underactive, it produces too little of the hormones thyroxine (T4) and triiodothyronine (T3). What are the causes? This condition may be caused by:  Hashimoto's disease. This is a disease in which the body's disease-fighting system (immune system) attacks the thyroid gland. This is the most common cause.  Viral infections.  Pregnancy.  Certain medicines.  Birth defects.  Past radiation treatments to the head or neck for cancer.  Past treatment with radioactive iodine.  Past exposure to radiation in the environment.  Past surgical removal of part or all of the thyroid.  Problems with a gland in the center of the brain (pituitary gland).  Lack of enough iodine in the diet. What increases the risk? You are more likely to develop this condition if:  You are female.  You have a family history of thyroid conditions.  You use a medicine called lithium.  You take medicines that affect the immune system (immunosuppressants). What are the signs or symptoms? Symptoms of this condition include:  Feeling as though you have no energy (lethargy).  Not being able to tolerate cold.  Weight gain that is not explained by a change in diet or exercise habits.  Lack of appetite.  Dry skin.  Coarse hair.  Menstrual irregularity.  Slowing of thought processes.  Constipation.  Sadness or depression. How is this diagnosed? This condition may be diagnosed based on:  Your symptoms, your medical history, and a physical exam.  Blood  tests. You may also have imaging tests, such as an ultrasound or MRI. How is this treated? This condition is treated with medicine that replaces the thyroid hormones that your body does not make. After you begin treatment, it may take several weeks for symptoms to go away. Follow these instructions at home:  Take over-the-counter and prescription medicines only as told by your health care provider.  If you start taking any new medicines, tell your health care provider.  Keep all follow-up visits as told by your health care provider. This is important. ? As your condition improves, your dosage of thyroid hormone medicine may change. ? You will need to have blood tests regularly so that your health care provider can monitor your condition. Contact a health care provider if:  Your symptoms do not get better with treatment.  You are taking thyroid replacement medicine and you: ? Sweat a lot. ? Have tremors. ? Feel anxious. ? Lose weight rapidly. ? Cannot tolerate heat. ? Have emotional swings. ? Have diarrhea. ? Feel weak. Get help right away if you have:  Chest pain.  An irregular heartbeat.  A rapid heartbeat.  Difficulty breathing. Summary  Hypothyroidism is when the thyroid gland does not make enough of certain hormones (it is underactive).  When the thyroid is underactive, it produces too little of the hormones thyroxine (T4) and triiodothyronine (T3).  The most common cause is Hashimoto's disease, a disease in which the body's disease-fighting system (immune system) attacks the thyroid gland. The condition can also be caused by viral infections, medicine, pregnancy, or past   radiation treatment to the head or neck.  Symptoms may include weight gain, dry skin, constipation, feeling as though you do not have energy, and not being able to tolerate cold.  This condition is treated with medicine to replace the thyroid hormones that your body does not make. This information  is not intended to replace advice given to you by your health care provider. Make sure you discuss any questions you have with your health care provider. Document Released: 11/01/2005 Document Revised: 10/12/2017 Document Reviewed: 10/12/2017 Elsevier Interactive Patient Education  2019 Elsevier Inc.  

## 2018-12-14 ENCOUNTER — Other Ambulatory Visit: Payer: Self-pay | Admitting: Internal Medicine

## 2018-12-14 DIAGNOSIS — E559 Vitamin D deficiency, unspecified: Secondary | ICD-10-CM

## 2018-12-14 LAB — CBC WITH DIFFERENTIAL/PLATELET
Basophils Absolute: 0 10*3/uL (ref 0.0–0.1)
Basophils Relative: 0.4 % (ref 0.0–3.0)
EOS PCT: 2.1 % (ref 0.0–5.0)
Eosinophils Absolute: 0.1 10*3/uL (ref 0.0–0.7)
HCT: 37.6 % (ref 36.0–46.0)
Hemoglobin: 12.5 g/dL (ref 12.0–15.0)
LYMPHS ABS: 2 10*3/uL (ref 0.7–4.0)
Lymphocytes Relative: 29.7 % (ref 12.0–46.0)
MCHC: 33.2 g/dL (ref 30.0–36.0)
MCV: 85.3 fl (ref 78.0–100.0)
MONOS PCT: 6.6 % (ref 3.0–12.0)
Monocytes Absolute: 0.4 10*3/uL (ref 0.1–1.0)
NEUTROS ABS: 4.1 10*3/uL (ref 1.4–7.7)
NEUTROS PCT: 61.2 % (ref 43.0–77.0)
PLATELETS: 241 10*3/uL (ref 150.0–400.0)
RBC: 4.41 Mil/uL (ref 3.87–5.11)
RDW: 14.7 % (ref 11.5–15.5)
WBC: 6.7 10*3/uL (ref 4.0–10.5)

## 2018-12-14 LAB — COMPREHENSIVE METABOLIC PANEL
ALT: 16 U/L (ref 0–35)
AST: 19 U/L (ref 0–37)
Albumin: 4.1 g/dL (ref 3.5–5.2)
Alkaline Phosphatase: 85 U/L (ref 39–117)
BUN: 10 mg/dL (ref 6–23)
CALCIUM: 9.2 mg/dL (ref 8.4–10.5)
CO2: 25 meq/L (ref 19–32)
Chloride: 110 mEq/L (ref 96–112)
Creatinine, Ser: 0.72 mg/dL (ref 0.40–1.20)
GFR: 91.28 mL/min (ref >60.00)
GLUCOSE: 88 mg/dL (ref 70–99)
POTASSIUM: 4.4 meq/L (ref 3.5–5.1)
Sodium: 141 mEq/L (ref 135–145)
Total Bilirubin: 0.2 mg/dL (ref 0.2–1.2)
Total Protein: 6.9 g/dL (ref 6.0–8.3)

## 2018-12-14 LAB — IRON,TIBC AND FERRITIN PANEL
%SAT: 10 % (calc) — ABNORMAL LOW (ref 16–45)
FERRITIN: 5 ng/mL — AB (ref 16–154)
IRON: 41 ug/dL (ref 40–190)
TIBC: 412 mcg/dL (calc) (ref 250–450)

## 2018-12-14 LAB — TEST AUTHORIZATION

## 2018-12-14 LAB — THYROID PEROXIDASE ANTIBODY: THYROID PEROXIDASE ANTIBODY: 73 [IU]/mL — AB (ref <9)

## 2018-12-14 LAB — TSH: TSH: 2.68 u[IU]/mL (ref 0.35–4.50)

## 2018-12-14 LAB — VITAMIN D 25 HYDROXY (VIT D DEFICIENCY, FRACTURES): VITD: 17.91 ng/mL — AB (ref 30.00–100.00)

## 2018-12-14 MED ORDER — CHOLECALCIFEROL 1.25 MG (50000 UT) PO CAPS: 50000.0000 [IU] | ORAL_CAPSULE | ORAL | 1 refills | Status: DC

## 2018-12-25 ENCOUNTER — Ambulatory Visit: Payer: 59 | Admitting: Internal Medicine

## 2019-03-16 ENCOUNTER — Other Ambulatory Visit: Payer: Self-pay | Admitting: Internal Medicine

## 2019-03-16 ENCOUNTER — Encounter: Payer: Self-pay | Admitting: Internal Medicine

## 2019-03-16 DIAGNOSIS — F419 Anxiety disorder, unspecified: Secondary | ICD-10-CM

## 2019-03-16 MED ORDER — SERTRALINE HCL 50 MG PO TABS
50.0000 mg | ORAL_TABLET | Freq: Every day | ORAL | 3 refills | Status: DC
Start: 2019-03-16 — End: 2020-06-18

## 2019-05-08 ENCOUNTER — Other Ambulatory Visit: Payer: Self-pay | Admitting: Internal Medicine

## 2019-05-08 DIAGNOSIS — E038 Other specified hypothyroidism: Secondary | ICD-10-CM

## 2019-05-08 MED ORDER — LEVOTHYROXINE SODIUM 50 MCG PO TABS: 50.0000 ug | ORAL_TABLET | Freq: Every day | ORAL | 3 refills | Status: DC

## 2019-06-11 ENCOUNTER — Encounter: Payer: Self-pay | Admitting: Internal Medicine

## 2019-06-11 ENCOUNTER — Other Ambulatory Visit: Payer: Self-pay | Admitting: Internal Medicine

## 2019-06-11 DIAGNOSIS — E559 Vitamin D deficiency, unspecified: Secondary | ICD-10-CM

## 2019-06-11 DIAGNOSIS — Z13220 Encounter for screening for lipoid disorders: Secondary | ICD-10-CM

## 2019-06-11 DIAGNOSIS — E063 Autoimmune thyroiditis: Secondary | ICD-10-CM

## 2019-06-11 DIAGNOSIS — Z1389 Encounter for screening for other disorder: Secondary | ICD-10-CM

## 2019-06-11 DIAGNOSIS — D509 Iron deficiency anemia, unspecified: Secondary | ICD-10-CM

## 2019-06-11 DIAGNOSIS — E039 Hypothyroidism, unspecified: Secondary | ICD-10-CM

## 2019-06-15 ENCOUNTER — Encounter: Payer: Self-pay | Admitting: Internal Medicine

## 2019-06-15 ENCOUNTER — Other Ambulatory Visit: Payer: Self-pay

## 2019-06-15 ENCOUNTER — Ambulatory Visit (INDEPENDENT_AMBULATORY_CARE_PROVIDER_SITE_OTHER): Payer: 59 | Admitting: Internal Medicine

## 2019-06-15 DIAGNOSIS — E038 Other specified hypothyroidism: Secondary | ICD-10-CM

## 2019-06-15 DIAGNOSIS — E063 Autoimmune thyroiditis: Secondary | ICD-10-CM

## 2019-06-15 DIAGNOSIS — E559 Vitamin D deficiency, unspecified: Secondary | ICD-10-CM

## 2019-06-15 DIAGNOSIS — E611 Iron deficiency: Secondary | ICD-10-CM | POA: Diagnosis not present

## 2019-06-15 DIAGNOSIS — Z0000 Encounter for general adult medical examination without abnormal findings: Secondary | ICD-10-CM | POA: Diagnosis not present

## 2019-06-15 DIAGNOSIS — D649 Anemia, unspecified: Secondary | ICD-10-CM | POA: Diagnosis not present

## 2019-06-15 NOTE — Progress Notes (Signed)
Virtual Visit via Video Note  I connected with Deborah King   on 06/15/19 at  8:00 AM EDT by a video enabled telemedicine application and verified that I am speaking with the correct person using two identifiers.  Location patient: home Location provider:work or home office Persons participating in the virtual visit: patient, provider  I discussed the limitations of evaluation and management by telemedicine and the availability of in person appointments. The patient expressed understanding and agreed to proceed.   HPI: 1. Hypothyroidism on levo 50 mc qd  2. Anxiety controlled on zoloft 50 mg qd pt is also exercising 4 days in the week in the am and 3 x at night  3. Vitamin D and iron def due for repeat labs  4. Annual   ROS: See pertinent positives and negatives per HPI. General wt stable  HEENT: no sore throat  CV: no chest pain  Lungs: no sob  Ab : No abdominal pain  GU: no issues  Skin: no skin lesions  MSK: no jt pain  Neuro: no h/a  Psych: anxiety controlled  Past Medical History:  Diagnosis Date  . Anemia   . Anxiety   . History of gestational diabetes   . History of kidney stones    During pregnancy  . Hypothyroidism   . Iron deficiency   . Vitamin D deficiency     Past Surgical History:  Procedure Laterality Date  . NO PAST SURGERIES      Family History  Problem Relation Age of Onset  . Hypertension Paternal Grandmother   . Arthritis Paternal Grandmother   . Cancer Paternal Grandmother   . Thyroid disease Mother   . Arthritis Mother   . Hyperlipidemia Mother   . Hypertension Father   . Arthritis Father   . Hyperlipidemia Father   . Cancer Maternal Grandmother        endometrial cancer  . Heart disease Maternal Grandfather   . Seizures Paternal Grandfather     SOCIAL HX: works at [employer] full time    Current Outpatient Medications:  .  Cholecalciferol 1.25 MG (50000 UT) capsule, Take 1 capsule (50,000 Units total) by mouth once a week.,  Disp: 13 capsule, Rfl: 1 .  levothyroxine (SYNTHROID) 50 MCG tablet, Take 1 tablet (50 mcg total) by mouth daily., Disp: 90 tablet, Rfl: 3 .  sertraline (ZOLOFT) 50 MG tablet, Take 1 tablet (50 mg total) by mouth daily., Disp: 90 tablet, Rfl: 3  EXAM:  VITALS per patient if applicable:  GENERAL: alert, oriented, appears well and in no acute distress  HEENT: atraumatic, conjunttiva clear, no obvious abnormalities on inspection of external nose and ears  NECK: normal movements of the head and neck  LUNGS: on inspection no signs of respiratory distress, breathing rate appears normal, no obvious gross SOB, gasping or wheezing  CV: no obvious cyanosis  MS: moves all visible extremities without noticeable abnormality  PSYCH/NEURO: pleasant and cooperative, no obvious depression or anxiety, speech and thought processing grossly intact  ASSESSMENT AND PLAN:  Discussed the following assessment and plan:  annual Declines all vaccines phobia of needles Never had hep B vaccine  Pap with OB/GYN Dr. Garwin Brothers get copy today last had 1.5 years ago per pt -sent request again for pap previously never obtained  -pt overdue rec she call Dr. Garwin Brothers to schedule a pap she states she will  Declines MMR check Continue healthy diet and exercise   Hypothyroidism due to Hashimoto's thyroiditis - Plan: check  TSH upcoming TPO abs  Vitamin D deficiency - Plan: check vitamin D on high dose weekly D3   Iron deficiency - Plan: check iron and CBC  Anemia, unspecified type      I discussed the assessment and treatment plan with the patient. The patient was provided an opportunity to ask questions and all were answered. The patient agreed with the plan and demonstrated an understanding of the instructions.   The patient was advised to call back or seek an in-person evaluation if the symptoms worsen or if the condition fails to improve as anticipated.  Time spent 15 minutes  Delorise Jackson, MD

## 2019-06-27 ENCOUNTER — Telehealth: Payer: Self-pay | Admitting: Internal Medicine

## 2019-06-27 NOTE — Telephone Encounter (Signed)
I sent pt a mychart message to call ofc to sch F/u in 6 months  Sch fasting labs asap

## 2019-07-05 ENCOUNTER — Ambulatory Visit: Payer: 59 | Admitting: Family Medicine

## 2019-09-06 ENCOUNTER — Other Ambulatory Visit: Payer: Self-pay

## 2019-09-06 DIAGNOSIS — Z20822 Contact with and (suspected) exposure to covid-19: Secondary | ICD-10-CM

## 2019-09-08 LAB — NOVEL CORONAVIRUS, NAA: SARS-CoV-2, NAA: NOT DETECTED

## 2019-11-15 ENCOUNTER — Other Ambulatory Visit: Payer: Self-pay

## 2019-11-15 ENCOUNTER — Ambulatory Visit: Payer: 59 | Attending: Internal Medicine

## 2019-11-15 DIAGNOSIS — Z20822 Contact with and (suspected) exposure to covid-19: Secondary | ICD-10-CM

## 2019-11-17 LAB — NOVEL CORONAVIRUS, NAA: SARS-CoV-2, NAA: NOT DETECTED

## 2019-12-13 ENCOUNTER — Telehealth: Payer: Self-pay | Admitting: Internal Medicine

## 2019-12-13 NOTE — Telephone Encounter (Signed)
I called pt twice and left a vm to call ofc. °

## 2019-12-19 ENCOUNTER — Ambulatory Visit: Payer: 59 | Admitting: Internal Medicine

## 2020-06-18 ENCOUNTER — Other Ambulatory Visit: Payer: Self-pay | Admitting: Internal Medicine

## 2020-06-18 ENCOUNTER — Telehealth: Payer: Self-pay | Admitting: Internal Medicine

## 2020-06-18 DIAGNOSIS — F419 Anxiety disorder, unspecified: Secondary | ICD-10-CM

## 2020-06-18 DIAGNOSIS — E063 Autoimmune thyroiditis: Secondary | ICD-10-CM

## 2020-06-18 MED ORDER — SERTRALINE HCL 50 MG PO TABS: 50.0000 mg | ORAL_TABLET | Freq: Every day | ORAL | 0 refills | Status: DC

## 2020-06-18 MED ORDER — LEVOTHYROXINE SODIUM 50 MCG PO TABS: 50.0000 ug | ORAL_TABLET | Freq: Every day | ORAL | 0 refills | Status: DC

## 2020-06-18 NOTE — Telephone Encounter (Signed)
Lm to call office to make an appt for a physical with fasting labs.

## 2020-06-18 NOTE — Telephone Encounter (Signed)
Call and sch CPE for pt fasting in am with labs  Thanks tMS

## 2020-09-15 ENCOUNTER — Other Ambulatory Visit: Payer: Self-pay | Admitting: Internal Medicine

## 2020-09-15 DIAGNOSIS — E038 Other specified hypothyroidism: Secondary | ICD-10-CM

## 2020-09-15 DIAGNOSIS — F419 Anxiety disorder, unspecified: Secondary | ICD-10-CM

## 2020-09-15 NOTE — Telephone Encounter (Signed)
Last TSH 12/13/18 please advise to refill Levothyroxine over one year since last TSH.

## 2020-09-16 MED ORDER — SERTRALINE HCL 50 MG PO TABS: 50.0000 mg | ORAL_TABLET | Freq: Every day | ORAL | 0 refills | Status: DC

## 2020-09-16 MED ORDER — LEVOTHYROXINE SODIUM 50 MCG PO TABS: 50.0000 ug | ORAL_TABLET | Freq: Every day | ORAL | 0 refills | Status: DC

## 2020-12-19 ENCOUNTER — Other Ambulatory Visit: Payer: Self-pay | Admitting: Internal Medicine

## 2020-12-19 DIAGNOSIS — F419 Anxiety disorder, unspecified: Secondary | ICD-10-CM

## 2020-12-20 ENCOUNTER — Other Ambulatory Visit: Payer: Self-pay | Admitting: Internal Medicine

## 2020-12-20 DIAGNOSIS — E038 Other specified hypothyroidism: Secondary | ICD-10-CM

## 2020-12-20 DIAGNOSIS — E063 Autoimmune thyroiditis: Secondary | ICD-10-CM

## 2021-03-28 ENCOUNTER — Other Ambulatory Visit: Payer: Self-pay | Admitting: Internal Medicine

## 2021-03-28 DIAGNOSIS — F419 Anxiety disorder, unspecified: Secondary | ICD-10-CM

## 2021-07-04 ENCOUNTER — Other Ambulatory Visit: Payer: Self-pay | Admitting: Internal Medicine

## 2021-07-04 DIAGNOSIS — F419 Anxiety disorder, unspecified: Secondary | ICD-10-CM

## 2021-09-29 ENCOUNTER — Other Ambulatory Visit: Payer: Self-pay | Admitting: Internal Medicine

## 2021-09-29 DIAGNOSIS — F419 Anxiety disorder, unspecified: Secondary | ICD-10-CM

## 2021-12-21 ENCOUNTER — Encounter: Payer: Self-pay | Admitting: Internal Medicine

## 2021-12-22 ENCOUNTER — Other Ambulatory Visit: Payer: Self-pay

## 2021-12-22 DIAGNOSIS — E559 Vitamin D deficiency, unspecified: Secondary | ICD-10-CM

## 2021-12-22 DIAGNOSIS — Z1389 Encounter for screening for other disorder: Secondary | ICD-10-CM

## 2021-12-22 DIAGNOSIS — E038 Other specified hypothyroidism: Secondary | ICD-10-CM

## 2021-12-22 DIAGNOSIS — Z0000 Encounter for general adult medical examination without abnormal findings: Secondary | ICD-10-CM

## 2022-04-16 ENCOUNTER — Encounter: Payer: Self-pay | Admitting: Internal Medicine

## 2022-04-22 ENCOUNTER — Ambulatory Visit: Payer: 59 | Admitting: Internal Medicine

## 2022-04-27 ENCOUNTER — Ambulatory Visit (INDEPENDENT_AMBULATORY_CARE_PROVIDER_SITE_OTHER): Payer: Commercial Managed Care - PPO | Admitting: Internal Medicine

## 2022-04-27 ENCOUNTER — Encounter: Payer: Self-pay | Admitting: Internal Medicine

## 2022-04-27 VITALS — BP 104/70 | HR 83 | Temp 98.3°F | Resp 14 | Ht 63.0 in | Wt 138.4 lb

## 2022-04-27 DIAGNOSIS — E538 Deficiency of other specified B group vitamins: Secondary | ICD-10-CM | POA: Diagnosis not present

## 2022-04-27 DIAGNOSIS — E038 Other specified hypothyroidism: Secondary | ICD-10-CM | POA: Diagnosis not present

## 2022-04-27 DIAGNOSIS — F419 Anxiety disorder, unspecified: Secondary | ICD-10-CM

## 2022-04-27 DIAGNOSIS — E063 Autoimmune thyroiditis: Secondary | ICD-10-CM | POA: Diagnosis not present

## 2022-04-27 DIAGNOSIS — E559 Vitamin D deficiency, unspecified: Secondary | ICD-10-CM | POA: Diagnosis not present

## 2022-04-27 DIAGNOSIS — Z0000 Encounter for general adult medical examination without abnormal findings: Secondary | ICD-10-CM

## 2022-04-27 DIAGNOSIS — Z1231 Encounter for screening mammogram for malignant neoplasm of breast: Secondary | ICD-10-CM

## 2022-04-27 LAB — COMPREHENSIVE METABOLIC PANEL
ALT: 9 U/L (ref 0–35)
AST: 16 U/L (ref 0–37)
Albumin: 4 g/dL (ref 3.5–5.2)
Alkaline Phosphatase: 47 U/L (ref 39–117)
BUN: 12 mg/dL (ref 6–23)
CO2: 25 mEq/L (ref 19–32)
Calcium: 8.7 mg/dL (ref 8.4–10.5)
Chloride: 108 mEq/L (ref 96–112)
Creatinine, Ser: 0.64 mg/dL (ref 0.40–1.20)
GFR: 110.79 mL/min (ref >60.00)
Glucose, Bld: 99 mg/dL (ref 70–99)
Potassium: 4.6 mEq/L (ref 3.5–5.1)
Sodium: 139 mEq/L (ref 135–145)
Total Bilirubin: 0.5 mg/dL (ref 0.2–1.2)
Total Protein: 6.1 g/dL (ref 6.0–8.3)

## 2022-04-27 LAB — CBC WITH DIFFERENTIAL/PLATELET
Basophils Absolute: 0 10*3/uL (ref 0.0–0.1)
Basophils Relative: 0.6 % (ref 0.0–3.0)
Eosinophils Absolute: 0 10*3/uL (ref 0.0–0.7)
Eosinophils Relative: 0.9 % (ref 0.0–5.0)
HCT: 36 % (ref 36.0–46.0)
Hemoglobin: 11.6 g/dL — ABNORMAL LOW (ref 12.0–15.0)
Lymphocytes Relative: 36.9 % (ref 12.0–46.0)
Lymphs Abs: 1.6 10*3/uL (ref 0.7–4.0)
MCHC: 32.3 g/dL (ref 30.0–36.0)
MCV: 82.1 fl (ref 78.0–100.0)
Monocytes Absolute: 0.3 10*3/uL (ref 0.1–1.0)
Monocytes Relative: 6.8 % (ref 3.0–12.0)
Neutro Abs: 2.4 10*3/uL (ref 1.4–7.7)
Neutrophils Relative %: 54.8 % (ref 43.0–77.0)
Platelets: 235 10*3/uL (ref 150.0–400.0)
RBC: 4.38 Mil/uL (ref 3.87–5.11)
RDW: 15.6 % — ABNORMAL HIGH (ref 11.5–15.5)
WBC: 4.4 10*3/uL (ref 4.0–10.5)

## 2022-04-27 LAB — LIPID PANEL
Cholesterol: 143 mg/dL (ref 0–200)
HDL: 64 mg/dL (ref >39.00)
LDL Cholesterol: 71 mg/dL (ref 0–99)
NonHDL: 79
Total CHOL/HDL Ratio: 2
Triglycerides: 40 mg/dL (ref 0.0–149.0)
VLDL: 8 mg/dL (ref 0.0–40.0)

## 2022-04-27 LAB — VITAMIN B12: Vitamin B-12: 308 pg/mL (ref 211–911)

## 2022-04-27 LAB — TSH: TSH: 4.24 u[IU]/mL (ref 0.35–5.50)

## 2022-04-27 LAB — VITAMIN D 25 HYDROXY (VIT D DEFICIENCY, FRACTURES): VITD: 37.2 ng/mL (ref 30.00–100.00)

## 2022-04-27 MED ORDER — LEVOTHYROXINE SODIUM 50 MCG PO TABS: 50.0000 ug | ORAL_TABLET | Freq: Every day | ORAL | 3 refills | Status: DC

## 2022-04-27 MED ORDER — SERTRALINE HCL 50 MG PO TABS: 50.0000 mg | ORAL_TABLET | Freq: Every day | ORAL | 3 refills | Status: DC

## 2022-04-27 NOTE — Patient Instructions (Signed)
Pap smear in the future call to schedule or with Dr. Cherly Hensen [address], [place], [place] [address] Hours:  Open ? Closes 5?PM Phone: [phone]

## 2022-04-27 NOTE — Progress Notes (Signed)
Chief Complaint  Patient presents with   Medication Refill    Pt requests refills on all meds, denies any other concerns or pain.   Annual 1. Hypothyroidism on levo 50 mcg  2. Anxiety uncontrolled zoloft 50 mg qd needs refill out of meds phq 9 score 6 and gad 7 score 4     Review of Systems  Constitutional:  Negative for weight loss.  HENT:  Negative for hearing loss.   Eyes:  Negative for blurred vision.  Respiratory:  Negative for shortness of breath.   Cardiovascular:  Negative for chest pain.  Gastrointestinal:  Negative for abdominal pain and blood in stool.  Genitourinary:  Negative for dysuria.  Musculoskeletal:  Negative for falls and joint pain.  Skin:  Negative for rash.  Neurological:  Negative for headaches.  Psychiatric/Behavioral:  Negative for depression.    Past Medical History:  Diagnosis Date   Anemia    Anxiety    History of gestational diabetes    History of kidney stones    During pregnancy   Hypothyroidism    Iron deficiency    Vitamin D deficiency    Past Surgical History:  Procedure Laterality Date   NO PAST SURGERIES     Family History  Problem Relation Age of Onset   Thyroid disease Mother    Arthritis Mother    Hyperlipidemia Mother    Memory loss Mother        72   Hypertension Father    Arthritis Father    Hyperlipidemia Father    Alcohol abuse Father    Cancer Maternal Grandmother        endometrial cancer   Heart disease Maternal Grandfather    Hypertension Paternal Grandmother    Arthritis Paternal Grandmother    Cancer Paternal Grandmother    Seizures Paternal Grandfather    Social History   Socioeconomic History   Marital status: Married    Spouse name: Not on file   Number of children: Not on file   Years of education: Not on file   Highest education level: Not on file  Occupational History   Not on file  Tobacco Use   Smoking status: Never   Smokeless tobacco: Never  Substance and Sexual Activity   Alcohol  use: No   Drug use: No   Sexual activity: Yes    Birth control/protection: None  Other Topics Concern   Not on file  Social History Narrative   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   No Known Allergies No results found for this or any previous visit (from the past 2160 hour(s)). Objective  Body mass index is 24.52 kg/m. Wt Readings from Last 3 Encounters:  04/27/22 138 lb 6.4 oz (62.8 kg)  12/13/18 181 lb 12.8 oz (82.5 kg)  04/11/18 172 lb 3.2 oz (78.1 kg)  Temp Readings from Last 3 Encounters:  04/27/22 98.3 F (36.8 C) (Oral)  12/13/18 98.5 F (36.9 C) (Oral)  04/11/18 98.4 F (36.9 C) (Oral)   BP Readings from Last 3 Encounters:  04/27/22 104/70  12/13/18 134/80  04/11/18 108/66   Pulse Readings from Last 3 Encounters:  04/27/22 83  12/13/18 76  04/11/18 (!) 57    Physical Exam Vitals and nursing note reviewed.  Constitutional:      Appearance: Normal appearance. She is well-developed and well-groomed.  HENT:     Head: Normocephalic and atraumatic.  Eyes:     Conjunctiva/sclera: Conjunctivae normal.     Pupils: Pupils are equal, round, and reactive to light.  Cardiovascular:     Rate and Rhythm: Normal rate and regular rhythm.     Heart sounds: Normal heart sounds. No murmur heard. Pulmonary:     Effort: Pulmonary effort is normal.     Breath sounds: Normal breath sounds.  Abdominal:      General: Abdomen is flat. Bowel sounds are normal.     Tenderness: There is no abdominal tenderness.  Musculoskeletal:        General: No tenderness.  Skin:    General: Skin is warm and dry.  Neurological:     General: No focal deficit present.     Mental Status: She is alert and oriented to person, place, and time. Mental status is at baseline.     Cranial Nerves: Cranial nerves 2-12 are intact.     Motor: Motor function is intact.     Coordination: Coordination is intact.     Gait: Gait is intact.  Psychiatric:        Attention and Perception: Attention and perception normal.        Mood and Affect: Mood and affect normal.        Speech: Speech normal.        Behavior: Behavior normal. Behavior is cooperative.        Thought Content: Thought content normal.        Cognition and Memory: Cognition and memory normal.        Judgment: Judgment normal.     Assessment  Plan  Annual physical exam - Plan: Comprehensive metabolic panel, Lipid panel, CBC with Differential/Platelet, TSH, Urinalysis, Routine w reflex microscopic  Hypothyroidism due to Hashimoto's thyroiditis - Plan: TSH, levothyroxine (SYNTHROID) 50 MCG tablet  Vitamin D deficiency - Plan: Vitamin D (25 hydroxy)  B12 deficiency - Plan: Vitamin B12  Anxiety - Plan: sertraline (ZOLOFT) 50 MG tablet   HM Declines all vaccines phobia of needles Never had hep B vaccine declines hep C Pap with OB/GYN Dr. Garwin Brothers overdue rec declines today 04/27/22   -pt overdue rec she call Dr. Garwin Brothers to schedule a pap she states she will  Declines MMR check  Continue healthy diet and exercise    Provider: Dr. Olivia Mackie McLean-Scocuzza-[facility]

## 2022-04-28 ENCOUNTER — Telehealth: Payer: Self-pay

## 2022-04-28 LAB — URINALYSIS, ROUTINE W REFLEX MICROSCOPIC
Bacteria, UA: NONE SEEN /HPF
Bilirubin Urine: NEGATIVE
Glucose, UA: NEGATIVE
Hgb urine dipstick: NEGATIVE
Hyaline Cast: NONE SEEN /LPF
Ketones, ur: NEGATIVE
Leukocytes,Ua: NEGATIVE
Nitrite: NEGATIVE
Protein, ur: NEGATIVE
RBC / HPF: NONE SEEN /HPF (ref 0–2)
Specific Gravity, Urine: 1.023 (ref 1.001–1.035)
Squamous Epithelial / HPF: NONE SEEN /HPF (ref <=5)
WBC, UA: NONE SEEN /HPF (ref 0–5)
pH: 6.5 (ref 5.0–8.0)

## 2022-04-28 NOTE — Telephone Encounter (Signed)
Lvm for pt to return call in regards to lab results.  Per Dr.Tracy: Blood cts improved  Liver kidneys normal  Cholesterol normal  Thyroid lab normal  Vitamin D normal continue vitamin D3 daily 2000 to 4000 IU dialy B12 normal Urine normal

## 2022-04-28 NOTE — Telephone Encounter (Signed)
Pt called stating she receive a message to call the office to go over lab results. Pt stated that she read them and do not have any questions

## 2022-08-11 ENCOUNTER — Telehealth: Payer: Commercial Managed Care - PPO | Admitting: Physician Assistant

## 2022-08-11 DIAGNOSIS — L239 Allergic contact dermatitis, unspecified cause: Secondary | ICD-10-CM

## 2022-08-11 MED ORDER — DOXYCYCLINE HYCLATE 100 MG PO TABS: 100.0000 mg | ORAL_TABLET | Freq: Two times a day (BID) | ORAL | 0 refills | Status: DC

## 2022-08-11 MED ORDER — PREDNISONE 10 MG PO TABS: ORAL_TABLET | ORAL | 0 refills | Status: AC

## 2022-08-11 NOTE — Progress Notes (Signed)
E-Visit for Poison Ivy  We are sorry that you are not feeing well.  Here is how we plan to help!  Based on what you have shared with me it looks like you have had an allergic reaction to the oily resin from a group of plants.  This resin is very sticky, so it easily attaches to your skin, clothing, tools equipment, and pet's fur.    This blistering rash is often called poison ivy rash although it can come from contact with the leaves, stems and roots of poison ivy, poison oak and poison sumac.  The oily resin contains urushiol (u-ROO-she-ol) that produces a skin rash on exposed skin.  The severity of the rash depends on the amount of urushiol that gets on your skin.  A section of skin with more urushiol on it may develop a rash sooner.  The rash usually develops 12-48 hours after exposure and can last two to three weeks.  Your skin must come in direct contact with the plant's oil to be affected.  Blister fluid doesn't spread the rash.  However, if you come into contact with a piece of clothing or pet fur that has urushiol on it, the rash may spread out.  You can also transfer the oil to other parts of your body with your fingers.  Often the rash looks like a straight line because of the way the plant brushes against your skin.  I suspect that a bacterial infection has developed at the rash site, and I have given you both an oral steroid and an oral antibiotic.  I have sent a prednisone dose pack to your chosen pharmacy. Be sure to follow the instructions carefully and complete the entire prescription.  You may use Benadryl or Caladryl topical lotions to sooth the itch and remember cool, not hot, showers and baths can help relieve the itching!  I have also sent in a prescription for an antibiotic: doxycycline 100 mg twice a day for 10 days.   Please schedule an appointment with your primary care provider to follow up on your skin infection within 2 weeks.  If drainage and red streaking continue to spread  you should be seen urgently.  Make sure that the clothes you were wearing and any towels or sheets that may have come in contact with the oil (urushiol) are washed in detergent and hot water.    I have developed the following plan to treat your condition I am prescribing a two week course of steroids (37 tablets of 10 mg prednisone).  Days 1-4 take 4 tablets (40 mg) daily  Days 5-8 take 3 tablets (30 mg) daily, Days 9-11 take 2 tablets (20 mg) daily, Days 12-14 take 1 tablet (10 mg) daily.  and I am prescribing Doxycycline 100 mg twice per day with food for ten days (20 tablets)   What can you do to prevent this rash?  Avoid the plants.  Learn how to identify poison ivy, poison oak and poison sumac in all seasons.  When hiking or engaging in other activities that might expose you to these plants, try to stay on cleared pathways.  If camping, make sure you pitch your tent in an area free of these plants.  Keep pets from running through wooded areas so that urushiol doesn't accidentally stick to their fur, which you may touch.  Remove or kill the plants.  In your yard, you can get rid of poison ivy by applying an herbicide or pulling it out   of the ground, including the roots, while wearing heavy gloves.  Afterward remove the gloves and thoroughly wash them and your hands.  Don't burn poison ivy or related plants because the urushiol can be carried by smoke.  Wear protective clothing.  If needed, protect your skin by wearing socks, boots, pants, long sleeves and vinyl gloves.  Wash your skin right away.  Washing off the oil with soap and water within 30 minutes of exposure may reduce your chances of getting a poison ivy rash.  Even washing after an hour or so can help reduce the severity of the rash.  If you walk through some poison ivy and then later touch your shoes, you may get some urushiol on your hands, which may then transfer to your face or body by touching or rubbing.  If the contaminated object  isn't cleaned, the urushiol on it can still cause a skin reaction years later.    Be careful not to reuse towels after you have washed your skin.  Also carefully wash clothing in detergent and hot water to remove all traces of the oil.  Handle contaminated clothing carefully so you don't transfer the urushiol to yourself, furniture, rugs or appliances.  Remember that pets can carry the oil on their fur and paws.  If you think your pet may be contaminated with urushiol, put on some long rubber gloves and give your pet a bath.  Finally, be careful not to burn these plants as the smoke can contain traces of the oil.  Inhaling the smoke may result in difficulty breathing. If that occurred you should see a physician as soon as possible.  See your doctor right away if:  The reaction is severe or widespread You inhaled the smoke from burning poison ivy and are having difficulty breathing Your skin continues to swell The rash affects your eyes, mouth or genitals Blisters are oozing pus You develop a fever greater than 100 F (37.8 C) The rash doesn't get better within a few weeks.  If you scratch the poison ivy rash, bacteria under your fingernails may cause the skin to become infected.  See your doctor if pus starts oozing from the blisters.  Treatment generally includes antibiotics.  Poison ivy treatments are usually limited to self-care methods.  And the rash typically goes away on its own in two to three weeks.     If the rash is widespread or results in a large number of blisters, your doctor may prescribe an oral corticosteroid, such as prednisone.  If a bacterial infection has developed at the rash site, your doctor may give you a prescription for an oral antibiotic.  MAKE SURE YOU  Understand these instructions. Will watch your condition. Will get help right away if you are not doing well or get worse.   Thank you for choosing an e-visit.  Your e-visit answers were reviewed by a  board certified advanced clinical practitioner to complete your personal care plan. Depending upon the condition, your plan could have included both over the counter or prescription medications.  Please review your pharmacy choice. Make sure the pharmacy is open so you can pick up prescription now. If there is a problem, you may contact your provider through MyChart messaging and have the prescription routed to another pharmacy.  Your safety is important to us. If you have drug allergies check your prescription carefully.   For the next 24 hours you can use MyChart to ask questions about today's visit, request a   non-urgent call back, or ask for a work or school excuse. You will get an email in the next two days asking about your experience. I hope that your e-visit has been valuable and will speed your recovery.    

## 2022-08-11 NOTE — Progress Notes (Signed)
I have spent 5 minutes in review of e-visit questionnaire, review and updating patient chart, medical decision making and response to patient.   Yatzari Jonsson Cody Ariyonna Twichell, PA-C    

## 2024-01-25 ENCOUNTER — Telehealth: Payer: Self-pay | Admitting: Family Medicine

## 2024-01-25 NOTE — Telephone Encounter (Signed)
 Dr Clent Ridges is leaving the practice and your Transfer of Care needs to be rescheduled with another provider. Please call the office to schedule a Transfer of Care to either Dr Charlann Lange, Darleen Crocker or Kara Dies, NP. [id number] please schedule.  Thank you

## 2024-02-07 ENCOUNTER — Ambulatory Visit: Payer: Commercial Managed Care - PPO | Admitting: Family Medicine

## 2024-12-19 ENCOUNTER — Ambulatory Visit: Admitting: Nurse Practitioner

## 2024-12-19 VITALS — BP 120/76 | HR 70 | Temp 98.1°F | Ht 63.0 in | Wt 155.6 lb

## 2024-12-19 DIAGNOSIS — E611 Iron deficiency: Secondary | ICD-10-CM

## 2024-12-19 DIAGNOSIS — Z0000 Encounter for general adult medical examination without abnormal findings: Secondary | ICD-10-CM

## 2024-12-19 DIAGNOSIS — Z13220 Encounter for screening for lipoid disorders: Secondary | ICD-10-CM

## 2024-12-19 DIAGNOSIS — Z1231 Encounter for screening mammogram for malignant neoplasm of breast: Secondary | ICD-10-CM

## 2024-12-19 DIAGNOSIS — E538 Deficiency of other specified B group vitamins: Secondary | ICD-10-CM

## 2024-12-19 DIAGNOSIS — F419 Anxiety disorder, unspecified: Secondary | ICD-10-CM

## 2024-12-19 DIAGNOSIS — E559 Vitamin D deficiency, unspecified: Secondary | ICD-10-CM

## 2024-12-19 DIAGNOSIS — E063 Autoimmune thyroiditis: Secondary | ICD-10-CM

## 2024-12-19 NOTE — Patient Instructions (Signed)
 YOUR MAMMOGRAM IS DUE, PLEASE CALL AND GET THIS SCHEDULED! [facility] - call [phone]

## 2024-12-19 NOTE — Progress Notes (Signed)
 " Leron Glance, NP-C Phone: [phone]  Discussed the use of AI scribe software for clinical note transcription with the patient, who gave verbal consent to proceed.  History of Present Illness   Deborah King is a 44 year old female with hypothyroidism who presents for transfer of care and management of her thyroid  condition.  She has been off her thyroid  medication, levothyroxine , for at least a year, possibly longer, due to difficulties in scheduling an appointment. She reports hair thinning, brittle nails, and dry skin. No heart palpitations, temperature intolerance, or significant weight changes recently. She recalls feeling better when on the medication previously.  She has a past medical history of anxiety, which has improved significantly since she is no longer in a stressful job or marriage. Previously on Zoloft , she no longer takes it and does not feel the need for it anymore.  No family history of breast, colon, or ovarian cancer. She has not had a mammogram or a Pap smear in recent years, with the last Pap smear possibly being in 2016.  In terms of social history, she does not smoke, drink alcohol, or use drugs. She has not seen a dentist or eye doctor in a while. She used to follow a keto diet but found it too restrictive and now focuses on making healthier food choices without obsessing over the scale. She drinks water primarily, with occasional Diet Coke and coffee.  She feels tired frequently, despite getting six to eight hours of sleep per night. She recalls feeling similarly fatigued before her initial thyroid  diagnosis. 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       Tobacco Use History[1]  Medications Ordered Prior to Encounter[2]   ROS see history of present illness  Objective  Physical Exam Vitals:   12/19/24 1503  BP: 120/76  Pulse: 70  Temp: 98.1 F (36.7 C)  SpO2: 99%    BP Readings  from Last 3 Encounters:  12/19/24 120/76  04/27/22 104/70  12/13/18 134/80   Wt Readings from Last 3 Encounters:  12/19/24 155 lb 9.6 oz (70.6 kg)  04/27/22 138 lb 6.4 oz (62.8 kg)  12/13/18 181 lb 12.8 oz (82.5 kg)    Physical Exam Constitutional:      General: She is not in acute distress.    Appearance: Normal appearance.  HENT:     Head: Normocephalic.     Right Ear: Tympanic membrane normal.     Left Ear: Tympanic membrane normal.     Nose: Nose normal.     Mouth/Throat:     Mouth: Mucous membranes are moist.     Pharynx: Oropharynx is clear.  Eyes:     Conjunctiva/sclera: Conjunctivae normal.     Pupils: Pupils are equal, round, and reactive to light.  Neck:     Thyroid : No thyromegaly.  Cardiovascular:     Rate and Rhythm: Normal rate and regular rhythm.     Heart sounds: Normal heart sounds.  Pulmonary:     Effort: Pulmonary effort is normal.     Breath sounds: Normal breath sounds.  Abdominal:     General: Abdomen is flat. Bowel sounds are normal.     Palpations: Abdomen is soft. There is no mass.     Tenderness: There is no abdominal tenderness.  Musculoskeletal:        General: Normal range of motion.  Lymphadenopathy:     Cervical: No cervical  adenopathy.  Skin:    General: Skin is warm and dry.     Findings: No rash.  Neurological:     General: No focal deficit present.     Mental Status: She is alert.  Psychiatric:        Mood and Affect: Mood normal.        Behavior: Behavior normal.      Assessment/Plan: Please see individual problem list.  Routine general medical examination at a health care facility Assessment & Plan: Routine wellness visit shows no major changes. Physical exam complete. Pap smear and mammogram are due. She has no family history of relevant cancers and no substance use. Her diet and exercise are improving. She lacks recent dental and eye exams. Politely declines flu and COVID vaccines. Tetanus vaccine is up to date. Ordered  a mammogram. Advised her to schedule a Pap smear at her convenience. Encouraged her to continue healthy dietary choices and regular physical activity. Return to care in one year, sooner as needed.   Orders: -     CBC with Differential/Platelet -     Comprehensive metabolic panel with GFR  Hypothyroidism due to Hashimoto's thyroiditis Assessment & Plan: Previously managed with levothyroxine  but off medication for over a year. She presents with hair thinning, brittle nails, dry skin, and fatigue, without heart palpitations or temperature intolerance. Check TSH to assess current levels. Will restart levothyroxine  based on lab results.   Orders: -     TSH  Anxiety Assessment & Plan: Previously managed with Zoloft . No longer taking. Does not feel it is needed at this time. Encouraged to contact if worsening symptoms, unusual behavior changes or suicidal thoughts occur. We will continue to monitor.    Vitamin D  deficiency -     VITAMIN D  25 Hydroxy (Vit-D Deficiency, Fractures)  B12 deficiency -     Vitamin B12  Iron  deficiency -     IBC + Ferritin  Lipid screening -     Lipid panel  Screening mammogram for breast cancer -     3D Screening Mammogram, Left and Right; Future     Return for Pap smear.   Leron Glance, NP-C [facility] [place]    [1]  Social History Tobacco Use  Smoking Status Never  Smokeless Tobacco Never  [2]  Current Outpatient Medications on File Prior to Visit  Medication Sig Dispense Refill   norgestimate-ethinyl estradiol (ORTHO-CYCLEN) 0.25-35 MG-MCG tablet Take 1 tablet by mouth daily.     levothyroxine  (SYNTHROID ) 50 MCG tablet Take 1 tablet (50 mcg total) by mouth daily. 30 min before breakfast appt further refills no EXCEPTIONS (Patient not taking: Reported on 12/19/2024) 90 tablet 3   No current facility-administered medications on file prior to visit.   "

## 2024-12-19 NOTE — Assessment & Plan Note (Signed)
 Previously managed with levothyroxine  but off medication for over a year. She presents with hair thinning, brittle nails, dry skin, and fatigue, without heart palpitations or temperature intolerance. Check TSH to assess current levels. Will restart levothyroxine  based on lab results.

## 2024-12-19 NOTE — Assessment & Plan Note (Signed)
 Routine wellness visit shows no major changes. Physical exam complete. Pap smear and mammogram are due. She has no family history of relevant cancers and no substance use. Her diet and exercise are improving. She lacks recent dental and eye exams. Politely declines flu and COVID vaccines. Tetanus vaccine is up to date. Ordered a mammogram. Advised her to schedule a Pap smear at her convenience. Encouraged her to continue healthy dietary choices and regular physical activity. Return to care in one year, sooner as needed.

## 2024-12-19 NOTE — Assessment & Plan Note (Signed)
 Previously managed with Zoloft . No longer taking. Does not feel it is needed at this time. Encouraged to contact if worsening symptoms, unusual behavior changes or suicidal thoughts occur. We will continue to monitor.

## 2024-12-20 ENCOUNTER — Ambulatory Visit: Payer: Self-pay | Admitting: Nurse Practitioner

## 2024-12-20 DIAGNOSIS — E063 Autoimmune thyroiditis: Secondary | ICD-10-CM

## 2024-12-20 DIAGNOSIS — E611 Iron deficiency: Secondary | ICD-10-CM

## 2024-12-20 LAB — COMPREHENSIVE METABOLIC PANEL WITH GFR
ALT: 16 U/L (ref 3–35)
AST: 20 U/L (ref 5–37)
Albumin: 4.1 g/dL (ref 3.5–5.2)
Alkaline Phosphatase: 47 U/L (ref 39–117)
BUN: 14 mg/dL (ref 6–23)
CO2: 28 meq/L (ref 19–32)
Calcium: 8.9 mg/dL (ref 8.4–10.5)
Chloride: 105 meq/L (ref 96–112)
Creatinine, Ser: 0.63 mg/dL (ref 0.40–1.20)
GFR: 109.16 mL/min
Glucose, Bld: 86 mg/dL (ref 70–99)
Potassium: 4.8 meq/L (ref 3.5–5.1)
Sodium: 137 meq/L (ref 135–145)
Total Bilirubin: 0.3 mg/dL (ref 0.2–1.2)
Total Protein: 6.6 g/dL (ref 6.0–8.3)

## 2024-12-20 LAB — VITAMIN B12: Vitamin B-12: 320 pg/mL (ref 211–911)

## 2024-12-20 LAB — CBC WITH DIFFERENTIAL/PLATELET
Basophils Absolute: 0.1 10*3/uL (ref 0.0–0.1)
Basophils Relative: 0.8 % (ref 0.0–3.0)
Eosinophils Absolute: 0.1 10*3/uL (ref 0.0–0.7)
Eosinophils Relative: 0.8 % (ref 0.0–5.0)
HCT: 37.9 % (ref 36.0–46.0)
Hemoglobin: 12.7 g/dL (ref 12.0–15.0)
Lymphocytes Relative: 28 % (ref 12.0–46.0)
Lymphs Abs: 1.9 10*3/uL (ref 0.7–4.0)
MCHC: 33.4 g/dL (ref 30.0–36.0)
MCV: 87.1 fl (ref 78.0–100.0)
Monocytes Absolute: 0.5 10*3/uL (ref 0.1–1.0)
Monocytes Relative: 6.7 % (ref 3.0–12.0)
Neutro Abs: 4.3 10*3/uL (ref 1.4–7.7)
Neutrophils Relative %: 63.7 % (ref 43.0–77.0)
Platelets: 260 10*3/uL (ref 150.0–400.0)
RBC: 4.35 Mil/uL (ref 3.87–5.11)
RDW: 14 % (ref 11.5–15.5)
WBC: 6.8 10*3/uL (ref 4.0–10.5)

## 2024-12-20 LAB — VITAMIN D 25 HYDROXY (VIT D DEFICIENCY, FRACTURES): VITD: 17.63 ng/mL — ABNORMAL LOW (ref 30.00–100.00)

## 2024-12-20 LAB — IBC + FERRITIN
Ferritin: 5.5 ng/mL — ABNORMAL LOW (ref 10.0–291.0)
Iron: 113 ug/dL (ref 42–145)
Saturation Ratios: 21.3 % (ref 20.0–50.0)
TIBC: 530.6 ug/dL — ABNORMAL HIGH (ref 250.0–450.0)
Transferrin: 379 mg/dL — ABNORMAL HIGH (ref 212.0–360.0)

## 2024-12-20 LAB — LIPID PANEL
Cholesterol: 174 mg/dL (ref 28–200)
HDL: 70.5 mg/dL
LDL Cholesterol: 91 mg/dL (ref 10–99)
NonHDL: 103.11
Total CHOL/HDL Ratio: 2
Triglycerides: 63 mg/dL (ref 10.0–149.0)
VLDL: 12.6 mg/dL (ref 0.0–40.0)

## 2024-12-20 LAB — TSH: TSH: 6.98 u[IU]/mL — ABNORMAL HIGH (ref 0.35–5.50)

## 2024-12-20 MED ORDER — LEVOTHYROXINE SODIUM 50 MCG PO TABS: 50.0000 ug | ORAL_TABLET | Freq: Every day | ORAL | 3 refills | Status: AC

## 2025-02-04 ENCOUNTER — Other Ambulatory Visit

## 2025-02-19 ENCOUNTER — Ambulatory Visit: Admitting: Nurse Practitioner
